# Patient Record
Sex: Male | Born: 1945 | Race: White | Hispanic: No | Marital: Married | Smoking: Former smoker
Health system: Southern US, Community
[De-identification: ages and names within clinical notes are randomized; demographics above are authoritative.]

## PROBLEM LIST (undated history)

## (undated) ENCOUNTER — Emergency Department (HOSPITAL_COMMUNITY): Payer: Self-pay | Source: Home / Self Care

## (undated) DIAGNOSIS — I5032 Chronic diastolic (congestive) heart failure: Secondary | ICD-10-CM

## (undated) DIAGNOSIS — I2510 Atherosclerotic heart disease of native coronary artery without angina pectoris: Secondary | ICD-10-CM

## (undated) DIAGNOSIS — E785 Hyperlipidemia, unspecified: Secondary | ICD-10-CM

## (undated) DIAGNOSIS — J309 Allergic rhinitis, unspecified: Secondary | ICD-10-CM

## (undated) DIAGNOSIS — Z9861 Coronary angioplasty status: Secondary | ICD-10-CM

## (undated) DIAGNOSIS — I350 Nonrheumatic aortic (valve) stenosis: Principal | ICD-10-CM

## (undated) DIAGNOSIS — I2119 ST elevation (STEMI) myocardial infarction involving other coronary artery of inferior wall: Secondary | ICD-10-CM

## (undated) HISTORY — DX: Allergic rhinitis, unspecified: J30.9

## (undated) HISTORY — DX: Hyperlipidemia, unspecified: E78.5

## (undated) HISTORY — DX: Nonrheumatic aortic (valve) stenosis: I35.0

## (undated) HISTORY — PX: TONSILLECTOMY: SUR1361

## (undated) HISTORY — DX: Chronic diastolic (congestive) heart failure: I50.32

## (undated) HISTORY — DX: ST elevation (STEMI) myocardial infarction involving other coronary artery of inferior wall: I21.19

---

## 2004-02-23 ENCOUNTER — Ambulatory Visit: Payer: Self-pay | Admitting: Internal Medicine

## 2004-03-01 ENCOUNTER — Ambulatory Visit: Payer: Self-pay | Admitting: Internal Medicine

## 2004-03-07 ENCOUNTER — Ambulatory Visit: Payer: Self-pay | Admitting: Internal Medicine

## 2004-03-13 ENCOUNTER — Ambulatory Visit: Payer: Self-pay | Admitting: Family Medicine

## 2004-07-08 ENCOUNTER — Ambulatory Visit: Payer: Self-pay | Admitting: Internal Medicine

## 2004-09-30 ENCOUNTER — Ambulatory Visit: Payer: Self-pay | Admitting: Internal Medicine

## 2005-02-03 ENCOUNTER — Ambulatory Visit: Payer: Self-pay | Admitting: Internal Medicine

## 2005-04-04 ENCOUNTER — Ambulatory Visit: Payer: Self-pay | Admitting: Internal Medicine

## 2006-01-09 ENCOUNTER — Ambulatory Visit: Payer: Self-pay | Admitting: Internal Medicine

## 2006-11-04 DIAGNOSIS — J309 Allergic rhinitis, unspecified: Secondary | ICD-10-CM | POA: Insufficient documentation

## 2006-11-04 DIAGNOSIS — E785 Hyperlipidemia, unspecified: Secondary | ICD-10-CM

## 2006-11-04 HISTORY — DX: Hyperlipidemia, unspecified: E78.5

## 2006-11-04 HISTORY — DX: Allergic rhinitis, unspecified: J30.9

## 2010-04-22 ENCOUNTER — Telehealth: Payer: Self-pay | Admitting: Internal Medicine

## 2010-04-22 DIAGNOSIS — Z0000 Encounter for general adult medical examination without abnormal findings: Secondary | ICD-10-CM

## 2010-04-22 NOTE — Telephone Encounter (Signed)
Appt scheduled 04/25/10 @1130  advised pt to come in at 11 for labwork per phone note//slm

## 2010-04-22 NOTE — Telephone Encounter (Signed)
Sunday please schedule him for 11:30 tomorrow or Thursday  time to come fasting at 11:00  for his blood work I put in future orders for his blood work.

## 2010-04-22 NOTE — Telephone Encounter (Signed)
Pt is req to come in for a complete cpx before end of month, for his insurance. Pls advise.

## 2010-04-25 ENCOUNTER — Encounter: Payer: Self-pay | Admitting: Internal Medicine

## 2010-04-25 ENCOUNTER — Ambulatory Visit (INDEPENDENT_AMBULATORY_CARE_PROVIDER_SITE_OTHER): Payer: BC Managed Care – PPO | Admitting: Internal Medicine

## 2010-04-25 VITALS — BP 140/90 | HR 68 | Temp 98.2°F | Resp 14 | Ht 72.0 in | Wt 188.0 lb

## 2010-04-25 DIAGNOSIS — Z0000 Encounter for general adult medical examination without abnormal findings: Secondary | ICD-10-CM

## 2010-04-25 DIAGNOSIS — M722 Plantar fascial fibromatosis: Secondary | ICD-10-CM | POA: Insufficient documentation

## 2010-04-25 DIAGNOSIS — J309 Allergic rhinitis, unspecified: Secondary | ICD-10-CM

## 2010-04-25 DIAGNOSIS — E785 Hyperlipidemia, unspecified: Secondary | ICD-10-CM

## 2010-04-25 LAB — POCT URINALYSIS DIPSTICK
Ketones, UA: NEGATIVE
Protein, UA: NEGATIVE
Spec Grav, UA: 1.02
Urobilinogen, UA: 0.2
pH, UA: 6

## 2010-04-25 LAB — CBC WITH DIFFERENTIAL/PLATELET
Basophils Absolute: 0 10*3/uL (ref 0.0–0.1)
Eosinophils Absolute: 0.1 10*3/uL (ref 0.0–0.7)
HCT: 44.2 % (ref 39.0–52.0)
Hemoglobin: 15.6 g/dL (ref 13.0–17.0)
Lymphs Abs: 1.9 10*3/uL (ref 0.7–4.0)
MCHC: 35.2 g/dL (ref 30.0–36.0)
MCV: 90.5 fl (ref 78.0–100.0)
Monocytes Absolute: 0.5 10*3/uL (ref 0.1–1.0)
Monocytes Relative: 7.3 % (ref 3.0–12.0)
Neutro Abs: 4.1 10*3/uL (ref 1.4–7.7)
Platelets: 164 10*3/uL (ref 150.0–400.0)
RDW: 13.7 % (ref 11.5–14.6)

## 2010-04-25 LAB — BASIC METABOLIC PANEL
CO2: 28 mEq/L (ref 19–32)
Calcium: 9.1 mg/dL (ref 8.4–10.5)
Creatinine, Ser: 0.8 mg/dL (ref 0.4–1.5)
GFR: 102.32 mL/min (ref >60.00)
Sodium: 137 mEq/L (ref 135–145)

## 2010-04-25 LAB — HEPATIC FUNCTION PANEL
Albumin: 4.2 g/dL (ref 3.5–5.2)
Alkaline Phosphatase: 64 U/L (ref 39–117)
Bilirubin, Direct: 0.1 mg/dL (ref 0.0–0.3)
Total Protein: 6.8 g/dL (ref 6.0–8.3)

## 2010-04-25 LAB — LIPID PANEL
HDL: 57.8 mg/dL (ref >39.00)
Triglycerides: 127 mg/dL (ref 0.0–149.0)
VLDL: 25.4 mg/dL (ref 0.0–40.0)

## 2010-04-25 NOTE — Progress Notes (Signed)
  Subjective:    Patient ID: Marcus Mills, male    DOB: 05-11-1945, 65 y.o.   MRN: [id number]  HPI  patient presents for complete physical examination. The patient has a history of hyperlipidemia and allergic rhinitis and presents for his yearly examination he has not had his laboratory values drawn yet he has a chief complaint today of bilateral foot pain worse on the right than the left he states that this has been diagnosed as plantar fasciitis in the past his symptoms have been present for approximately 2 years he states that he has pain in the morning when he first gets out of bed and it improves as the day goes on he has attempted stretching and arch supports as they intervention which has been partially successful.  He has exercised regularly his weight is stable and he has been watching his diet for a modified low cholesterol. His current medications were reviewed with the patient, as well as his past medical history social history surgical history  And problem list.   Review of Systems  Constitutional: Negative for fever and fatigue.        Male pattern balding  HENT: Negative for hearing loss, congestion, neck pain and postnasal drip.   Eyes: Negative for discharge, redness and visual disturbance.  Respiratory: Negative for cough, shortness of breath and wheezing.   Cardiovascular: Negative for leg swelling.  Gastrointestinal: Negative for abdominal pain, constipation and abdominal distention.  Genitourinary: Negative for urgency and frequency.        Prostate is +2 enlarged no bogginess detected architecture and symmetry normal  Musculoskeletal: Negative.  Negative for joint swelling and arthralgias.  Skin: Negative.  Negative for color change and rash.        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  Hematological: Negative for adenopathy.  Psychiatric/Behavioral: Negative for behavioral problems.         Objective:   Physical Exam        Assessment & Plan:

## 2010-04-25 NOTE — Assessment & Plan Note (Signed)
He currently is on no medications for treatment of hyperlipidemia with a previous diagnosis he has lost weight and exercises regular he is following a low-cholesterol diet we will monitor his lipid panel at this time and see if intervention is wanted it is I would recommend  Fish oil thousand milligrams 2 by mouth twice a day

## 2010-04-25 NOTE — Assessment & Plan Note (Signed)
He has seasonal allergies and is currently not experiencing any symptoms of allergic rhinitis it was recommended to use Claritin or Zyrtec when necessary during his allergy season

## 2010-04-25 NOTE — Assessment & Plan Note (Signed)
He presents for his complete physical examination and appropriate screening laboratory values were obtained today and will be reviewed and the patient notified of the results we recommended a flu vaccination on a yearly basis the patient has not received a flu vaccination for over 2 years we discussed the benefit of immunization in prevention of disease specifically what immunization could do to enhance his overall health.   we discussed screening colonoscopy and other health maintenance issues he is up-to-date at this point we have agreed to begin a yearly program of health maintenance with physical examinations on a yearly basis.

## 2010-04-25 NOTE — Assessment & Plan Note (Signed)
The patient has pain on the plantar fascia after resting or sleeping stretching helps the pain he has not used ice or heat at this time.  He has used arch supports which seems to help but the pain has been recurrent over 2-3 years.   we have recommended that he obtain new arch supports not go barefoot at any time stretch 3-4 times daily using a heel cup stretch and use a frozen coke bottle as the massaging and stretching device nightly if these interventions fail either a referral to podiatry or a fascial injection is warranted

## 2010-04-25 NOTE — Patient Instructions (Signed)
Consider using salt palmetto herbal supplement for your benign prostatic hypertrophy. Sure that whatever combination Includes zinc. For the plantar fasciitis I recommend active stretching using the he'll drop stretch on the stairs the use of a frozen Coca-Cola bottle for rolling massage wearing arch supports and if these interventions are ineffective he may schedule a plantar fascial injection. We will review your laboratory work and notify you of any further interventions based upon these we will monitor your cholesterol and see if you are at your current goals if not we would recommend beginning fish oil

## 2010-04-26 ENCOUNTER — Encounter: Payer: Self-pay | Admitting: Internal Medicine

## 2010-05-01 NOTE — Progress Notes (Signed)
  Subjective:    Patient ID: Marcus Mills, male    DOB: [birth year], 65 y.o.   MRN: [id number]  HPI    Review of Systems     Objective:   Physical Exam  Constitutional: He is oriented to person, place, and time. He appears well-developed and well-nourished.        Well-developed well-nourished white male in no apparent distress with male pattern balding  HENT:  Head: Normocephalic and atraumatic.  Eyes: Conjunctivae are normal. Pupils are equal, round, and reactive to light.  Neck: Normal range of motion. Neck supple.  Cardiovascular: Normal rate and regular rhythm.   Pulmonary/Chest: Effort normal and breath sounds normal.  Abdominal: Soft. Bowel sounds are normal.  Genitourinary: Rectum normal, prostate normal and penis normal.  Neurological: He is alert and oriented to person, place, and time. He has normal reflexes.  Skin: Skin is warm and dry.  Psychiatric: He has a normal mood and affect. His behavior is normal. Judgment and thought content normal.          Assessment & Plan:

## 2010-05-09 ENCOUNTER — Telehealth: Payer: Self-pay | Admitting: Internal Medicine

## 2010-05-09 NOTE — Telephone Encounter (Signed)
Triage vm----please return call about where to get the corn removed and about his statin rx.

## 2010-05-09 NOTE — Telephone Encounter (Signed)
Talked with pt and you and he discussed him going on a statin and he would like to go on a statin like his wife's that she on ly takes once a week.  ( I cant find anything in  Chart about a statin she is taking-)  I gave him Dr Legrand Pitts name for removal of corn

## 2010-05-10 NOTE — Telephone Encounter (Signed)
We'll begin this patient on Crestor 20 mg one by mouth weekly I would recommend that he come by the office and pick up some samples next week to see if we can demonstrate the effectiveness of pulse therapy for him. I will have the samples in my office

## 2010-05-15 ENCOUNTER — Other Ambulatory Visit: Payer: Self-pay | Admitting: *Deleted

## 2010-05-15 MED ORDER — PITAVASTATIN CALCIUM 4 MG PO TABS: 4.0000 mg | ORAL_TABLET | ORAL | Status: DC

## 2010-05-15 NOTE — Telephone Encounter (Signed)
DR Lovell Sheehan CHANGED TO LIVALO LIKE WIFE IS TAKING AND THAT WAS SENT IN TO PHARMACY OF CHOICE

## 2010-07-10 ENCOUNTER — Telehealth: Payer: Self-pay | Admitting: Internal Medicine

## 2010-07-10 MED ORDER — FEXOFENADINE-PSEUDOEPHED ER 180-240 MG PO TB24: 1.0000 | ORAL_TABLET | Freq: Every day | ORAL | Status: DC

## 2010-07-10 NOTE — Telephone Encounter (Signed)
Pt called and is req to get Allegra D calld in to [facility] in [place] for allergies.

## 2011-02-10 ENCOUNTER — Telehealth: Payer: Self-pay | Admitting: *Deleted

## 2011-02-10 MED ORDER — AZITHROMYCIN 250 MG PO TABS: ORAL_TABLET | ORAL | Status: AC

## 2011-02-10 NOTE — Telephone Encounter (Signed)
Per dr jenkins-may have z pack 

## 2011-02-10 NOTE — Telephone Encounter (Signed)
Patient's wife is calling because husband has the same symptoms with cough, drainage for 1 week.  Can a rx be called in?

## 2011-04-24 ENCOUNTER — Telehealth: Payer: Self-pay | Admitting: Family Medicine

## 2011-04-24 NOTE — Telephone Encounter (Signed)
Marcus Mills, this patient is really needing a physical. Due to his insurance, he needs it before February is over. They will be back in [place] the wk of 2/17. Is there any way this is possible? I told the wife I would ask, and we'd call them back. Thanks.

## 2011-04-25 NOTE — Telephone Encounter (Signed)
Monday at 8:30

## 2011-04-25 NOTE — Telephone Encounter (Signed)
We have nothing by the end of the mo nth if he can not come [place]

## 2011-04-25 NOTE — Telephone Encounter (Signed)
Called pt and LMOM and let him know.

## 2011-05-13 ENCOUNTER — Telehealth: Payer: Self-pay | Admitting: Internal Medicine

## 2011-05-13 NOTE — Telephone Encounter (Signed)
Sent and wife informed he must bring lab with him

## 2011-05-13 NOTE — Telephone Encounter (Signed)
Pt is req to get a lab order for cpx labs, mailed to pts [address] [facility] [address] , so he can get labs drawn at [facility], which is closer to their home.  Pts cpx is sch for 06/06/11.

## 2011-05-26 ENCOUNTER — Other Ambulatory Visit: Payer: BC Managed Care – PPO

## 2011-06-06 ENCOUNTER — Ambulatory Visit (INDEPENDENT_AMBULATORY_CARE_PROVIDER_SITE_OTHER): Payer: BC Managed Care – PPO | Admitting: Internal Medicine

## 2011-06-06 VITALS — BP 130/80 | HR 72 | Temp 98.3°F | Resp 16 | Ht 70.0 in | Wt 198.0 lb

## 2011-06-06 DIAGNOSIS — Z23 Encounter for immunization: Secondary | ICD-10-CM

## 2011-06-06 DIAGNOSIS — Z2911 Encounter for prophylactic immunotherapy for respiratory syncytial virus (RSV): Secondary | ICD-10-CM

## 2011-06-06 DIAGNOSIS — E785 Hyperlipidemia, unspecified: Secondary | ICD-10-CM

## 2011-06-06 DIAGNOSIS — Z0000 Encounter for general adult medical examination without abnormal findings: Secondary | ICD-10-CM

## 2011-06-06 MED ORDER — ROSUVASTATIN CALCIUM 20 MG PO TABS: 20.0000 mg | ORAL_TABLET | Freq: Two times a day (BID) | ORAL | Status: DC

## 2011-06-06 MED ORDER — ROSUVASTATIN CALCIUM 20 MG PO TABS: 20.0000 mg | ORAL_TABLET | ORAL | Status: DC

## 2011-06-10 ENCOUNTER — Encounter: Payer: Self-pay | Admitting: Internal Medicine

## 2011-06-10 NOTE — Patient Instructions (Signed)
The patient is instructed to continue all medications as prescribed. Schedule followup with check out clerk upon leaving the clinic  

## 2011-06-10 NOTE — Progress Notes (Signed)
Subjective:    Patient ID: Marcus Mills, male    DOB: [birth year], 66 y.o.   MRN: [id number]  HPI The patient presents for routine complete physical examination his active medical problems include hyperlipidemia allergic rhinitis a history of plantar fasciitis.  His current medications were reviewed with him including issues of omega-3 fatty acids he has not been on a statin which he was originally placed on a pulse therapy statin. He discontinued it several weeks prior to his blood work    Review of Systems  Constitutional: Negative for fever and fatigue.  HENT: Negative for hearing loss, congestion, neck pain and postnasal drip.   Eyes: Negative for discharge, redness and visual disturbance.  Respiratory: Negative for cough, shortness of breath and wheezing.   Cardiovascular: Negative for leg swelling.  Gastrointestinal: Negative for abdominal pain, constipation and abdominal distention.  Genitourinary: Negative for urgency and frequency.  Musculoskeletal: Negative for joint swelling and arthralgias.  Skin: Negative for color change and rash.  Neurological: Negative for weakness and light-headedness.  Hematological: Negative for adenopathy.  Psychiatric/Behavioral: Negative for behavioral problems.   Past Medical History  Diagnosis Date  . ALLERGIC RHINITIS 11/04/2006  . HYPERLIPIDEMIA 11/04/2006    History   Social History  . Marital Status: Married    Spouse Name: N/A    Number of Children: N/A  . Years of Education: N/A   Occupational History  . salesman    Social History Main Topics  . Smoking status: Former Smoker    Quit date: 04/26/1987  . Smokeless tobacco: Not on file  . Alcohol Use: 0.5 oz/week    1 drink(s) per week  . Drug Use: No  . Sexually Active: Yes   Other Topics Concern  . Not on file   Social History Narrative  . No narrative on file    Past Surgical History  Procedure Date  . Tonsillectomy     Family History  Problem Relation Age  of Onset  . Hypertension Mother   . Cancer Mother   . Cancer Father   . Breast cancer Sister     No Known Allergies  Current Outpatient Prescriptions on File Prior to Visit  Medication Sig Dispense Refill  . Cholecalciferol (RA VITAMIN D-3) 2000 UNITS CAPS Take by mouth.        . fexofenadine-pseudoephedrine (ALLEGRA-D 24) 180-240 MG per 24 hr tablet Take 1 tablet by mouth daily.  30 tablet  3  . fish oil-omega-3 fatty acids 1000 MG capsule Take 2 g by mouth daily.        . folic acid (FOLVITE) 1 MG tablet Take 1 mg by mouth daily.        . Multiple Vitamin (MULTIVITAMIN) tablet Take 1 tablet by mouth daily.          BP 130/80  Pulse 72  Temp 98.3 F (36.8 C)  Resp 16  Ht 5\' 10"  (1.778 m)  Wt 198 lb (89.812 kg)  BMI 28.41 kg/m2       Objective:   Physical Exam  Vitals reviewed. Constitutional: He is oriented to person, place, and time. He appears well-developed and well-nourished.  HENT:  Head: Normocephalic and atraumatic.  Eyes: Conjunctivae are normal. Pupils are equal, round, and reactive to light.  Neck: Normal range of motion. Neck supple.  Cardiovascular: Normal rate and regular rhythm.   Pulmonary/Chest: Effort normal and breath sounds normal.  Abdominal: Soft. Bowel sounds are normal.  Genitourinary: Rectum normal and prostate normal.  Musculoskeletal: Normal range of motion.  Neurological: He is alert and oriented to person, place, and time.  Skin: Skin is warm and dry.  Psychiatric: He has a normal mood and affect. His behavior is normal.          Assessment & Plan:    Patient presents for yearly preventative medicine examination.   all immunizations and health maintenance protocols were reviewed with the patient and they are up to date with these protocols.   screening laboratory values were reviewed with the patient including screening of hyperlipidemia PSA renal function and hepatic function.   There medications past medical history social  history problem list and allergies were reviewed in detail.   Goals were established with regard to weight loss exercise diet in compliance with medications  Reviewed the patient's lipid results and do to his age, family history and elevated lipids he has risk factors enough to warrant resuming a statin to control his cholesterol.  We will place him on Crestor 20 mg on Monday and Friday and plan a return office visit within 3 months to recheck a lipid and liver at that time.

## 2011-06-11 ENCOUNTER — Encounter: Payer: Self-pay | Admitting: Internal Medicine

## 2011-06-23 ENCOUNTER — Other Ambulatory Visit: Payer: Self-pay | Admitting: *Deleted

## 2011-07-26 ENCOUNTER — Other Ambulatory Visit: Payer: Self-pay | Admitting: Internal Medicine

## 2011-11-12 ENCOUNTER — Ambulatory Visit (INDEPENDENT_AMBULATORY_CARE_PROVIDER_SITE_OTHER)
Admission: RE | Admit: 2011-11-12 | Discharge: 2011-11-12 | Disposition: A | Discharge status: Home / Self Care | Payer: BC Managed Care – PPO | Source: Ambulatory Visit | Attending: Family Medicine | Admitting: Family Medicine

## 2011-11-12 ENCOUNTER — Ambulatory Visit (INDEPENDENT_AMBULATORY_CARE_PROVIDER_SITE_OTHER): Payer: BC Managed Care – PPO | Admitting: Family Medicine

## 2011-11-12 ENCOUNTER — Encounter: Payer: Self-pay | Admitting: Family Medicine

## 2011-11-12 VITALS — BP 122/90 | Temp 97.9°F | Wt 192.0 lb

## 2011-11-12 DIAGNOSIS — M545 Low back pain, unspecified: Secondary | ICD-10-CM

## 2011-11-12 MED ORDER — DICLOFENAC EPOLAMINE 1.3 % TD PTCH: 1.0000 | MEDICATED_PATCH | Freq: Two times a day (BID) | TRANSDERMAL | Status: DC

## 2011-11-12 NOTE — Patient Instructions (Addendum)
Low Back Sprain with Rehab  A sprain is an injury in which a ligament is torn. The ligaments of the lower back are vulnerable to sprains. However, they are strong and require great force to be injured. These ligaments are important for stabilizing the spinal column. Sprains are classified into three categories. Grade 1 sprains cause pain, but the tendon is not lengthened. Grade 2 sprains include a lengthened ligament, due to the ligament being stretched or partially ruptured. With grade 2 sprains there is still function, although the function may be decreased. Grade 3 sprains involve a complete tear of the tendon or muscle, and function is usually impaired. SYMPTOMS   Severe pain in the lower back.   Sometimes, a feeling of a "pop," "snap," or tear, at the time of injury.   Tenderness and sometimes swelling at the injury site.   Uncommonly, bruising (contusion) within 48 hours of injury.   Muscle spasms in the back.  CAUSES  Low back sprains occur when a force is placed on the ligaments that is greater than they can handle. Common causes of injury include:  Performing a stressful act while off-balance.   Repetitive stressful activities that involve movement of the lower back.   Direct hit (trauma) to the lower back.  RISK INCREASES WITH:  Contact sports (football, wrestling).   Collisions (major skiing accidents).   Sports that require throwing or lifting (baseball, weightlifting).   Sports involving twisting of the spine (gymnastics, diving, tennis, golf).   Poor strength and flexibility.   Inadequate protection.   Previous back injury or surgery (especially fusion).  PREVENTION  Wear properly fitted and padded protective equipment.   Warm up and stretch properly before activity.   Allow for adequate recovery between workouts.   Maintain physical fitness:   Strength, flexibility, and endurance.   Cardiovascular fitness.   Maintain a healthy body weight.  PROGNOSIS   If treated properly, low back sprains usually heal with non-surgical treatment. The length of time for healing depends on the severity of the injury.  RELATED COMPLICATIONS   Recurring symptoms, resulting in a chronic problem.   Chronic inflammation and pain in the low back.   Delayed healing or resolution of symptoms, especially if activity is resumed too soon.   Prolonged impairment.   Unstable or arthritic joints of the low back.  TREATMENT  Treatment first involves the use of ice and medicine, to reduce pain and inflammation. The use of strengthening and stretching exercises may help reduce pain with activity. These exercises may be performed at home or with a therapist. Severe injuries may require referral to a therapist for further evaluation and treatment, such as ultrasound. Your caregiver may advise that you wear a back brace or corset, to help reduce pain and discomfort. Often, prolonged bed rest results in greater harm then benefit. Corticosteroid injections may be recommended. However, these should be reserved for the most serious cases. It is important to avoid using your back when lifting objects. At night, sleep on your back on a firm mattress, with a pillow placed under your knees. If non-surgical treatment is unsuccessful, surgery may be needed.  MEDICATION   If pain medicine is needed, nonsteroidal anti-inflammatory medicines (aspirin and ibuprofen), or other minor pain relievers (acetaminophen), are often advised.   Do not take pain medicine for 7 days before surgery.   Prescription pain relievers may be given, if your caregiver thinks they are needed. Use only as directed and only as much as you   need.   Ointments applied to the skin may be helpful.   Corticosteroid injections may be given by your caregiver. These injections should be reserved for the most serious cases, because they may only be given a certain number of times.  HEAT AND COLD  Cold treatment (icing)  should be applied for 10 to 15 minutes every 2 to 3 hours for inflammation and pain, and immediately after activity that aggravates your symptoms. Use ice packs or an ice massage.   Heat treatment may be used before performing stretching and strengthening activities prescribed by your caregiver, physical therapist, or athletic trainer. Use a heat pack or a warm water soak.  SEEK MEDICAL CARE IF:   Symptoms get worse or do not improve in 2 to 4 weeks, despite treatment.   You develop numbness or weakness in either leg.   You lose bowel or bladder function.   Any of the following occur after surgery: fever, increased pain, swelling, redness, drainage of fluids, or bleeding in the affected area.   New, unexplained symptoms develop. (Drugs used in treatment may produce side effects.)  EXERCISES  RANGE OF MOTION (ROM) AND STRETCHING EXERCISES - Low Back Sprain Most people with lower back pain will find that their symptoms get worse with excessive bending forward (flexion) or arching at the lower back (extension). The exercises that will help resolve your symptoms will focus on the opposite motion.  Your physician, physical therapist or athletic trainer will help you determine which exercises will be most helpful to resolve your lower back pain. Do not complete any exercises without first consulting with your caregiver. Discontinue any exercises which make your symptoms worse, until you speak to your caregiver. If you have pain, numbness or tingling which travels down into your buttocks, leg or foot, the goal of the therapy is for these symptoms to move closer to your back and eventually resolve. Sometimes, these leg symptoms will get better, but your lower back pain may worsen. This is often an indication of progress in your rehabilitation. Be very alert to any changes in your symptoms and the activities in which you participated in the 24 hours prior to the change. Sharing this information with your  caregiver will allow him or her to most efficiently treat your condition. These exercises may help you when beginning to rehabilitate your injury. Your symptoms may resolve with or without further involvement from your physician, physical therapist or athletic trainer. While completing these exercises, remember:   Restoring tissue flexibility helps normal motion to return to the joints. This allows healthier, less painful movement and activity.   An effective stretch should be held for at least 30 seconds.   A stretch should never be painful. You should only feel a gentle lengthening or release in the stretched tissue.  FLEXION RANGE OF MOTION AND STRETCHING EXERCISES: STRETCH - Flexion, Single Knee to Chest   Lie on a firm bed or floor with both legs extended in front of you.   Keeping one leg in contact with the floor, bring your opposite knee to your chest. Hold your leg in place by either grabbing behind your thigh or at your knee.   Pull until you feel a gentle stretch in your low back. Hold __________ seconds.   Slowly release your grasp and repeat the exercise with the opposite side.  Repeat __________ times. Complete this exercise __________ times per day.  STRETCH - Flexion, Double Knee to Chest  Lie on a firm bed or   floor with both legs extended in front of you.   Keeping one leg in contact with the floor, bring your opposite knee to your chest.   Tense your stomach muscles to support your back and then lift your other knee to your chest. Hold your legs in place by either grabbing behind your thighs or at your knees.   Pull both knees toward your chest until you feel a gentle stretch in your low back. Hold __________ seconds.   Tense your stomach muscles and slowly return one leg at a time to the floor.  Repeat __________ times. Complete this exercise __________ times per day.  STRETCH - Low Trunk Rotation  Lie on a firm bed or floor. Keeping your legs in front of you, bend  your knees so they are both pointed toward the ceiling and your feet are flat on the floor.   Extend your arms out to the side. This will stabilize your upper body by keeping your shoulders in contact with the floor.   Gently and slowly drop both knees together to one side until you feel a gentle stretch in your low back. Hold for __________ seconds.   Tense your stomach muscles to support your lower back as you bring your knees back to the starting position. Repeat the exercise to the other side.  Repeat __________ times. Complete this exercise __________ times per day  EXTENSION RANGE OF MOTION AND FLEXIBILITY EXERCISES: STRETCH - Extension, Prone on Elbows   Lie on your stomach on the floor, a bed will be too soft. Place your palms about shoulder width apart and at the height of your head.   Place your elbows under your shoulders. If this is too painful, stack pillows under your chest.   Allow your body to relax so that your hips drop lower and make contact more completely with the floor.   Hold this position for __________ seconds.   Slowly return to lying flat on the floor.  Repeat __________ times. Complete this exercise __________ times per day.  RANGE OF MOTION - Extension, Prone Press Ups  Lie on your stomach on the floor, a bed will be too soft. Place your palms about shoulder width apart and at the height of your head.   Keeping your back as relaxed as possible, slowly straighten your elbows while keeping your hips on the floor. You may adjust the placement of your hands to maximize your comfort. As you gain motion, your hands will come more underneath your shoulders.   Hold this position __________ seconds.   Slowly return to lying flat on the floor.  Repeat __________ times. Complete this exercise __________ times per day.  RANGE OF MOTION- Quadruped, Neutral Spine   Assume a hands and knees position on a firm surface. Keep your hands under your shoulders and your knees  under your hips. You may place padding under your knees for comfort.   Drop your head and point your tailbone toward the ground below you. This will round out your lower back like an angry cat. Hold this position for __________ seconds.   Slowly lift your head and release your tail bone so that your back sags into a large arch, like an old horse.   Hold this position for __________ seconds.   Repeat this until you feel limber in your low back.   Now, find your "sweet spot." This will be the most comfortable position somewhere between the two previous positions. This is your neutral spine.   Once you have found this position, tense your stomach muscles to support your low back.   Hold this position for __________ seconds.  Repeat __________ times. Complete this exercise __________ times per day.  STRENGTHENING EXERCISES - Low Back Sprain These exercises may help you when beginning to rehabilitate your injury. These exercises should be done near your "sweet spot." This is the neutral, low-back arch, somewhere between fully rounded and fully arched, that is your least painful position. When performed in this safe range of motion, these exercises can be used for people who have either a flexion or extension based injury. These exercises may resolve your symptoms with or without further involvement from your physician, physical therapist or athletic trainer. While completing these exercises, remember:   Muscles can gain both the endurance and the strength needed for everyday activities through controlled exercises.   Complete these exercises as instructed by your physician, physical therapist or athletic trainer. Increase the resistance and repetitions only as guided.   You may experience muscle soreness or fatigue, but the pain or discomfort you are trying to eliminate should never worsen during these exercises. If this pain does worsen, stop and make certain you are following the directions exactly.  If the pain is still present after adjustments, discontinue the exercise until you can discuss the trouble with your caregiver.  STRENGTHENING - Deep Abdominals, Pelvic Tilt   Lie on a firm bed or floor. Keeping your legs in front of you, bend your knees so they are both pointed toward the ceiling and your feet are flat on the floor.   Tense your lower abdominal muscles to press your low back into the floor. This motion will rotate your pelvis so that your tail bone is scooping upwards rather than pointing at your feet or into the floor.  With a gentle tension and even breathing, hold this position for __________ seconds. Repeat __________ times. Complete this exercise __________ times per day.  STRENGTHENING - Abdominals, Crunches   Lie on a firm bed or floor. Keeping your legs in front of you, bend your knees so they are both pointed toward the ceiling and your feet are flat on the floor. Cross your arms over your chest.   Slightly tip your chin down without bending your neck.   Tense your abdominals and slowly lift your trunk high enough to just clear your shoulder blades. Lifting higher can put excessive stress on the lower back and does not further strengthen your abdominal muscles.   Control your return to the starting position.  Repeat __________ times. Complete this exercise __________ times per day.  STRENGTHENING - Quadruped, Opposite UE/LE Lift   Assume a hands and knees position on a firm surface. Keep your hands under your shoulders and your knees under your hips. You may place padding under your knees for comfort.   Find your neutral spine and gently tense your abdominal muscles so that you can maintain this position. Your shoulders and hips should form a rectangle that is parallel with the floor and is not twisted.   Keeping your trunk steady, lift your right hand no higher than your shoulder and then your left leg no higher than your hip. Make sure you are not holding your  breath. Hold this position for __________ seconds.   Continuing to keep your abdominal muscles tense and your back steady, slowly return to your starting position. Repeat with the opposite arm and leg.  Repeat __________ times. Complete this exercise __________ times   per day.  STRENGTHENING - Abdominals and Quadriceps, Straight Leg Raise   Lie on a firm bed or floor with both legs extended in front of you.   Keeping one leg in contact with the floor, bend the other knee so that your foot can rest flat on the floor.   Find your neutral spine, and tense your abdominal muscles to maintain your spinal position throughout the exercise.   Slowly lift your straight leg off the floor about 6 inches for a count of 15, making sure to not hold your breath.   Still keeping your neutral spine, slowly lower your leg all the way to the floor.  Repeat this exercise with each leg __________ times. Complete this exercise __________ times per day. POSTURE AND BODY MECHANICS CONSIDERATIONS - Low Back Sprain Keeping correct posture when sitting, standing or completing your activities will reduce the stress put on different body tissues, allowing injured tissues a chance to heal and limiting painful experiences. The following are general guidelines for improved posture. Your physician or physical therapist will provide you with any instructions specific to your needs. While reading these guidelines, remember:  The exercises prescribed by your provider will help you have the flexibility and strength to maintain correct postures.   The correct posture provides the best environment for your joints to work. All of your joints have less wear and tear when properly supported by a spine with good posture. This means you will experience a healthier, less painful body.   Correct posture must be practiced with all of your activities, especially prolonged sitting and standing. Correct posture is as important when doing  repetitive low-stress activities (typing) as it is when doing a single heavy-load activity (lifting).  RESTING POSITIONS Consider which positions are most painful for you when choosing a resting position. If you have pain with flexion-based activities (sitting, bending, stooping, squatting), choose a position that allows you to rest in a less flexed posture. You would want to avoid curling into a fetal position on your side. If your pain worsens with extension-based activities (prolonged standing, working overhead), avoid resting in an extended position such as sleeping on your stomach. Most people will find more comfort when they rest with their spine in a more neutral position, neither too rounded nor too arched. Lying on a non-sagging bed on your side with a pillow between your knees, or on your back with a pillow under your knees will often provide some relief. Keep in mind, being in any one position for a prolonged period of time, no matter how correct your posture, can still lead to stiffness. PROPER SITTING POSTURE In order to minimize stress and discomfort on your spine, you must sit with correct posture. Sitting with good posture should be effortless for a healthy body. Returning to good posture is a gradual process. Many people can work toward this most comfortably by using various supports until they have the flexibility and strength to maintain this posture on their own. When sitting with proper posture, your ears will fall over your shoulders and your shoulders will fall over your hips. You should use the back of the chair to support your upper back. Your lower back will be in a neutral position, just slightly arched. You may place a small pillow or folded towel at the base of your lower back for  support.  When working at a desk, create an environment that supports good, upright posture. Without extra support, muscles tire, which leads to excessive   strain on joints and other tissues. Keep these  recommendations in mind: CHAIR:  A chair should be able to slide under your desk when your back makes contact with the back of the chair. This allows you to work closely.   The chair's height should allow your eyes to be level with the upper part of your monitor and your hands to be slightly lower than your elbows.  BODY POSITION  Your feet should make contact with the floor. If this is not possible, use a foot rest.   Keep your ears over your shoulders. This will reduce stress on your neck and low back.  INCORRECT SITTING POSTURES  If you are feeling tired and unable to assume a healthy sitting posture, do not slouch or slump. This puts excessive strain on your back tissues, causing more damage and pain. Healthier options include:  Using more support, like a lumbar pillow.   Switching tasks to something that requires you to be upright or walking.   Talking a brief walk.   Lying down to rest in a neutral-spine position.  PROLONGED STANDING WHILE SLIGHTLY LEANING FORWARD  When completing a task that requires you to lean forward while standing in one place for a long time, place either foot up on a stationary 2-4 inch high object to help maintain the best posture. When both feet are on the ground, the lower back tends to lose its slight inward curve. If this curve flattens (or becomes too large), then the back and your other joints will experience too much stress, tire more quickly, and can cause pain. CORRECT STANDING POSTURES Proper standing posture should be assumed with all daily activities, even if they only take a few moments, like when brushing your teeth. As in sitting, your ears should fall over your shoulders and your shoulders should fall over your hips. You should keep a slight tension in your abdominal muscles to brace your spine. Your tailbone should point down to the ground, not behind your body, resulting in an over-extended swayback posture.  INCORRECT STANDING POSTURES    Common incorrect standing postures include a forward head, locked knees and/or an excessive swayback. WALKING Walk with an upright posture. Your ears, shoulders and hips should all line-up. PROLONGED ACTIVITY IN A FLEXED POSITION When completing a task that requires you to bend forward at your waist or lean over a low surface, try to find a way to stabilize 3 out of 4 of your limbs. You can place a hand or elbow on your thigh or rest a knee on the surface you are reaching across. This will provide you more stability, so that your muscles do not tire as quickly. By keeping your knees relaxed, or slightly bent, you will also reduce stress across your lower back. CORRECT LIFTING TECHNIQUES DO :  Assume a wide stance. This will provide you more stability and the opportunity to get as close as possible to the object which you are lifting.   Tense your abdominals to brace your spine. Bend at the knees and hips. Keeping your back locked in a neutral-spine position, lift using your leg muscles. Lift with your legs, keeping your back straight.   Test the weight of unknown objects before attempting to lift them.   Try to keep your elbows locked down at your sides in order get the best strength from your shoulders when carrying an object.   Always ask for help when lifting heavy or awkward objects.  INCORRECT LIFTING TECHNIQUES DO   NOT:   Lock your knees when lifting, even if it is a small object.   Bend and twist. Pivot at your feet or move your feet when needing to change directions.   Assume that you can safely pick up even a paperclip without proper posture.  Document Released: 03/03/2005 Document Revised: 02/20/2011 Document Reviewed: 06/15/2008 [employer] Patient Information 2012 [employer]. 

## 2011-11-12 NOTE — Progress Notes (Signed)
  Subjective:    Patient ID: Marcus Mills, male    DOB: 06/23/1945, 66 y.o.   MRN: [id number]  HPI  Patient seen with low back pain. Larey Seat almost 2 weeks: 11/01/2011. He was trimming a limb out of a tree on 12 foot ladder. Branch came down and hit him in the head but there was no loss of consciousness. He was knocked off ladder and landed on his heels and back side. Noted some lumbar back pain and left ankle pain. Went to emergency room in [facility]. X-rays left foot and ankle unremarkable. Patient prescribed Vicodin, naproxen, and Flexeril without much relief. Had some continued low back pain mostly upper lumbar area. Pain is worse with standing and sitting and relieved with stretching out. No radiculopathy symptoms. Pain is located upper lumbar area. No numbness or weakness. No urine incontinence. Pain is moderate. Interfering with sleep. He denies without much relief. Pain radiates slightly left of upper lumbar area.  No hematuria   Review of Systems  HENT: Negative for neck pain.   Gastrointestinal: Negative for abdominal pain.  Genitourinary: Negative for hematuria.  Musculoskeletal: Positive for back pain.  Neurological: Negative for dizziness and headaches.  Psychiatric/Behavioral: Negative for confusion.       Objective:   Physical Exam  Constitutional: He appears well-developed and well-nourished. No distress.  Cardiovascular: Normal rate and regular rhythm.   Pulmonary/Chest: Effort normal and breath sounds normal. No respiratory distress. He has no wheezes. He has no rales.  Musculoskeletal: He exhibits no edema.       Straight leg raises are negative bilaterally Lumbar and thoracic back reveal no bruising. No swelling. Minimal tenderness around L1-L2 region. He has some paralumbar tenderness musculature or left side  Neurological:       Full-strength lower extremities. Symmetric lower extremity reflexes.          Assessment & Plan:  Upper lumbar back  pain following fall. Lumbar back films. Rule out fracture.  diclofenac skin patch twice daily. Stretches given. Consider physical therapy if not improving soon

## 2011-11-18 ENCOUNTER — Telehealth: Payer: Self-pay | Admitting: Family Medicine

## 2011-11-18 DIAGNOSIS — M549 Dorsalgia, unspecified: Secondary | ICD-10-CM | POA: Insufficient documentation

## 2011-11-18 NOTE — Telephone Encounter (Signed)
We need to explain to patient that insurance will not cover this medication for him  See how his pain is doing.  If still needing something for pain, let me know and we can consider oral pain med.

## 2011-11-18 NOTE — Telephone Encounter (Signed)
Per pt's insurance, the Prior Auth or [employer] 1.3% was denied for the following reason: this med is only approved with a "Dx of acute pain due to a minor sprain, strain, or contusion". I did send them all the records to read over, so they had all information regarding pt's injury. Please advise as to appeal or new med. Thank you.

## 2011-11-19 DIAGNOSIS — S32019A Unspecified fracture of first lumbar vertebra, initial encounter for closed fracture: Secondary | ICD-10-CM | POA: Insufficient documentation

## 2011-11-19 NOTE — Telephone Encounter (Signed)
Marcus Mills, can you call the pt and advise re Dr Lucie Leather instructions please?

## 2011-11-20 NOTE — Telephone Encounter (Signed)
Pt informed on personally identified VM re: initial med not covered by insurance, however if he is still experiencing pain, call back for oral pain med

## 2011-12-08 ENCOUNTER — Ambulatory Visit: Payer: BC Managed Care – PPO | Admitting: Internal Medicine

## 2012-01-03 ENCOUNTER — Encounter: Payer: Self-pay | Admitting: Family Medicine

## 2012-01-03 ENCOUNTER — Telehealth: Payer: Self-pay | Admitting: Internal Medicine

## 2012-01-03 ENCOUNTER — Ambulatory Visit (INDEPENDENT_AMBULATORY_CARE_PROVIDER_SITE_OTHER): Payer: BC Managed Care – PPO | Admitting: Family Medicine

## 2012-01-03 VITALS — BP 130/88 | HR 74 | Temp 98.7°F | Wt 177.0 lb

## 2012-01-03 DIAGNOSIS — B029 Zoster without complications: Secondary | ICD-10-CM | POA: Insufficient documentation

## 2012-01-03 NOTE — Progress Notes (Signed)
  Subjective:    Patient ID: Marcus Mills, male    DOB: 15-Aug-1945, 65 y.o.   MRN: [id number]  HPI  66 year old male with new onset rash  6 days ago. First appeared red bumps at left lip edge then under nose. Gradually over the week spread to left cheek. Preceeded by sensitivity in the area.  Painful (feels like needles), no clear blisters. Initial lesions are drying up.   Left upper teeth sensitive. No visual change. No fever. No flu like illness.  No new exposures.  Has had shingles vaccines.  Seen at urgent care yesterday by PA.. Given valcyclovir and bactrim  To cover shingels as well as bacterial infection.   Taking hydrocodone for back pain.  Has     Review of Systems  Constitutional: Negative for fever and fatigue.  HENT: Negative for ear pain.   Respiratory: Negative for cough.   Cardiovascular: Negative for chest pain.  Gastrointestinal: Negative for abdominal pain.       Objective:   Physical Exam  Constitutional: He appears well-developed and well-nourished.  HENT:  Right Ear: External ear normal.  Left Ear: External ear normal.  Nose: Nose normal.  Mouth/Throat: Oropharynx is clear and moist.  Eyes: Conjunctivae normal and EOM are normal. Pupils are equal, round, and reactive to light.  Neck: Normal range of motion. Neck supple.  Cardiovascular: Normal rate.   Pulmonary/Chest: Effort normal and breath sounds normal.  Skin:       Erythema and scabbing at left lip edge, under nose, erythematous papule, no vesicles under left eye and on cheek. No pusules. Tender to palpation          Assessment & Plan:

## 2012-01-03 NOTE — Telephone Encounter (Signed)
Caller: [place]/Patient; PCP: Darryll Capers (Adults only); CB#: [phone]; Call regarding Facial rash L cheek and under L eye; onset 12/29/11.  States he had a vaccine a year ago.  States seen in UC 01/02/12; started acyclovir, bactrim.    Face is mildly puffy/swollen.  States the rash is approaching the lower lid.  Per protocol, emergent symptoms denied; advised appointment within 24 hours.  Appt scheduled 01/03/12 1130 with Dr. Ermalene Searing in [place] office.

## 2012-01-03 NOTE — Assessment & Plan Note (Signed)
Given location.. See eye MD ASAP for eye exam. Valacyclovir likely not to be helpful since started late but since has from urgent acar go ahead and complete.  Doubt bacterial in fection but okay to complete bactrim 10 day course since you have it from Urgent care. Most important is pain control.. Use ibuprofen and vicodin. Call if not controlled.

## 2012-01-03 NOTE — Patient Instructions (Addendum)
Use vicodin every 6-8 hour for breakthrough pain.  Use ibuprofen 800 mg every 8 hours.  Complete bactrim and valacyclovir.  Call if pain not controlled.  Get eye MD  appt for left eye evaluation.

## 2012-01-06 ENCOUNTER — Telehealth: Payer: Self-pay | Admitting: Internal Medicine

## 2012-01-06 NOTE — Telephone Encounter (Signed)
[facility] Triage Call Report Triage Record Num: [id number] Operator: Chevis Pretty Patient Name: Marcus Mills Call Date & Time: 01/03/2012 8:42:35AM Patient Phone: [phone] PCP: Darryll Capers Patient Gender: Male PCP Fax : [phone] Patient DOB: 1945/10/04 [facility] Name: [facility]  Reason for Call: Caller: Marcus Mills/Patient; PCP: Darryll Capers (Adults only); CB#: [phone]; Call regarding Facial rash L cheek and under L eye; onset 12/29/11. States he had a vaccine a year ago. States seen in UC 01/02/12; started acyclovir, bactrim. Face is mildly puffy/swollen. States the rash is approaching the lower lid. Per protocol, emergent symptoms denied; advised appointment within 24 hours. Appt scheduled 01/03/12 1130 with Dr. Ermalene Searing in [place] office. Protocol(s) Used: Face Pain or Swelling Recommended Outcome per Protocol: See Provider within 24 hours Reason for Outcome: Painful OR itchy rash on face Care Advice: ~ Call provider immediately if rash near to or involves eyes or tip of nose. ~ Call provider if signs of infection occur such as tenderness, swelling, redness, or temperature elevation. During pregnancy, call provider if temperature is 100 F (37.7 C) or greater OR any temperature elevation for 3 days even while taking acetaminophen. ~ ~ SYMPTOM / CONDITION MANAGEMENT ~ CAUTIONS ~ List, or take, all current prescription(s), nonprescription or alternative medication(s) to provider for evaluation. Analgesic/Antipyretic Advice - Acetaminophen: Consider acetaminophen as directed on label or by pharmacist/provider for pain or fever PRECAUTIONS: - Use if there is no history of liver disease, alcoholism, or intake of three or more alcohol drinks per day - Only if approved by provider during pregnancy or when breastfeeding - During pregnancy, acetaminophen should not be taken more than 3 consecutive days without telling provider - Do not exceed recommended dose or  frequency ~ Analgesic/Antipyretic Advice - NSAIDs: Consider aspirin, ibuprofen, naproxen or ketoprofen for pain or fever as directed on label or by pharmacist/provider. PRECAUTIONS: - If over 21 years of age, should not take longer than 1 week without consulting provider. EXCEPTIONS: - Should not be used if taking blood thinners or have bleeding problems. - Do not use if have history of sensitivity/allergy to any of these medications; or history of cardiovascular, ulcer, kidney, liver disease or diabetes unless approved by provider. - Do not exceed recommended dose or frequenc

## 2015-07-06 DIAGNOSIS — E785 Hyperlipidemia, unspecified: Secondary | ICD-10-CM | POA: Diagnosis not present

## 2015-07-06 DIAGNOSIS — Z0000 Encounter for general adult medical examination without abnormal findings: Secondary | ICD-10-CM | POA: Diagnosis not present

## 2015-07-13 DIAGNOSIS — Z1389 Encounter for screening for other disorder: Secondary | ICD-10-CM | POA: Diagnosis not present

## 2015-07-13 DIAGNOSIS — Z6829 Body mass index (BMI) 29.0-29.9, adult: Secondary | ICD-10-CM | POA: Diagnosis not present

## 2015-07-13 DIAGNOSIS — Z23 Encounter for immunization: Secondary | ICD-10-CM | POA: Diagnosis not present

## 2015-07-13 DIAGNOSIS — R358 Other polyuria: Secondary | ICD-10-CM | POA: Diagnosis not present

## 2015-07-13 DIAGNOSIS — E663 Overweight: Secondary | ICD-10-CM | POA: Diagnosis not present

## 2015-07-13 DIAGNOSIS — Z125 Encounter for screening for malignant neoplasm of prostate: Secondary | ICD-10-CM | POA: Diagnosis not present

## 2015-07-13 DIAGNOSIS — E784 Other hyperlipidemia: Secondary | ICD-10-CM | POA: Diagnosis not present

## 2015-07-13 DIAGNOSIS — Z0000 Encounter for general adult medical examination without abnormal findings: Secondary | ICD-10-CM | POA: Diagnosis not present

## 2015-09-13 DIAGNOSIS — D3131 Benign neoplasm of right choroid: Secondary | ICD-10-CM | POA: Diagnosis not present

## 2015-09-13 DIAGNOSIS — H353131 Nonexudative age-related macular degeneration, bilateral, early dry stage: Secondary | ICD-10-CM | POA: Diagnosis not present

## 2015-10-08 DIAGNOSIS — L570 Actinic keratosis: Secondary | ICD-10-CM | POA: Diagnosis not present

## 2015-10-08 DIAGNOSIS — L820 Inflamed seborrheic keratosis: Secondary | ICD-10-CM | POA: Diagnosis not present

## 2016-07-08 DIAGNOSIS — Z125 Encounter for screening for malignant neoplasm of prostate: Secondary | ICD-10-CM | POA: Diagnosis not present

## 2016-07-08 DIAGNOSIS — R358 Other polyuria: Secondary | ICD-10-CM | POA: Diagnosis not present

## 2016-07-08 DIAGNOSIS — E784 Other hyperlipidemia: Secondary | ICD-10-CM | POA: Diagnosis not present

## 2016-07-18 DIAGNOSIS — E663 Overweight: Secondary | ICD-10-CM | POA: Diagnosis not present

## 2016-07-18 DIAGNOSIS — E784 Other hyperlipidemia: Secondary | ICD-10-CM | POA: Diagnosis not present

## 2016-07-18 DIAGNOSIS — Z1389 Encounter for screening for other disorder: Secondary | ICD-10-CM | POA: Diagnosis not present

## 2016-07-18 DIAGNOSIS — Z0000 Encounter for general adult medical examination without abnormal findings: Secondary | ICD-10-CM | POA: Diagnosis not present

## 2016-07-18 DIAGNOSIS — Z23 Encounter for immunization: Secondary | ICD-10-CM | POA: Diagnosis not present

## 2016-07-18 DIAGNOSIS — R51 Headache: Secondary | ICD-10-CM | POA: Diagnosis not present

## 2016-07-21 DIAGNOSIS — Z1212 Encounter for screening for malignant neoplasm of rectum: Secondary | ICD-10-CM | POA: Diagnosis not present

## 2016-09-21 DIAGNOSIS — I2510 Atherosclerotic heart disease of native coronary artery without angina pectoris: Secondary | ICD-10-CM

## 2016-09-21 DIAGNOSIS — I2119 ST elevation (STEMI) myocardial infarction involving other coronary artery of inferior wall: Secondary | ICD-10-CM | POA: Diagnosis present

## 2016-09-21 DIAGNOSIS — Z9861 Coronary angioplasty status: Secondary | ICD-10-CM

## 2016-09-21 HISTORY — DX: Atherosclerotic heart disease of native coronary artery without angina pectoris: I25.10

## 2016-09-22 ENCOUNTER — Inpatient Hospital Stay (HOSPITAL_COMMUNITY): Payer: BLUE CROSS/BLUE SHIELD

## 2016-09-22 ENCOUNTER — Encounter (HOSPITAL_COMMUNITY): Payer: Self-pay | Admitting: *Deleted

## 2016-09-22 ENCOUNTER — Inpatient Hospital Stay (HOSPITAL_COMMUNITY)
Admission: AD | Admit: 2016-09-22 | Discharge: 2016-09-24 | DRG: 250 | Disposition: A | Discharge status: Home / Self Care | Payer: BLUE CROSS/BLUE SHIELD | Attending: Interventional Cardiology | Admitting: Interventional Cardiology

## 2016-09-22 ENCOUNTER — Encounter (HOSPITAL_COMMUNITY)
Admission: AD | Disposition: A | Discharge status: Home / Self Care | Payer: Self-pay | Source: Home / Self Care | Attending: Interventional Cardiology

## 2016-09-22 DIAGNOSIS — E7800 Pure hypercholesterolemia, unspecified: Secondary | ICD-10-CM | POA: Diagnosis not present

## 2016-09-22 DIAGNOSIS — I472 Ventricular tachycardia: Secondary | ICD-10-CM | POA: Diagnosis not present

## 2016-09-22 DIAGNOSIS — I340 Nonrheumatic mitral (valve) insufficiency: Secondary | ICD-10-CM | POA: Diagnosis not present

## 2016-09-22 DIAGNOSIS — I213 ST elevation (STEMI) myocardial infarction of unspecified site: Secondary | ICD-10-CM | POA: Diagnosis not present

## 2016-09-22 DIAGNOSIS — R011 Cardiac murmur, unspecified: Secondary | ICD-10-CM | POA: Diagnosis present

## 2016-09-22 DIAGNOSIS — I2510 Atherosclerotic heart disease of native coronary artery without angina pectoris: Secondary | ICD-10-CM

## 2016-09-22 DIAGNOSIS — R001 Bradycardia, unspecified: Secondary | ICD-10-CM | POA: Diagnosis not present

## 2016-09-22 DIAGNOSIS — I77810 Thoracic aortic ectasia: Secondary | ICD-10-CM | POA: Diagnosis present

## 2016-09-22 DIAGNOSIS — Z87891 Personal history of nicotine dependence: Secondary | ICD-10-CM

## 2016-09-22 DIAGNOSIS — E785 Hyperlipidemia, unspecified: Secondary | ICD-10-CM | POA: Diagnosis present

## 2016-09-22 DIAGNOSIS — I2119 ST elevation (STEMI) myocardial infarction involving other coronary artery of inferior wall: Principal | ICD-10-CM

## 2016-09-22 DIAGNOSIS — Z9861 Coronary angioplasty status: Secondary | ICD-10-CM

## 2016-09-22 DIAGNOSIS — I2111 ST elevation (STEMI) myocardial infarction involving right coronary artery: Secondary | ICD-10-CM | POA: Diagnosis not present

## 2016-09-22 DIAGNOSIS — E784 Other hyperlipidemia: Secondary | ICD-10-CM | POA: Diagnosis not present

## 2016-09-22 DIAGNOSIS — Z8249 Family history of ischemic heart disease and other diseases of the circulatory system: Secondary | ICD-10-CM

## 2016-09-22 DIAGNOSIS — Z803 Family history of malignant neoplasm of breast: Secondary | ICD-10-CM | POA: Diagnosis not present

## 2016-09-22 DIAGNOSIS — I350 Nonrheumatic aortic (valve) stenosis: Secondary | ICD-10-CM | POA: Clinically undetermined

## 2016-09-22 DIAGNOSIS — I5031 Acute diastolic (congestive) heart failure: Secondary | ICD-10-CM | POA: Diagnosis not present

## 2016-09-22 DIAGNOSIS — I700 Atherosclerosis of aorta: Secondary | ICD-10-CM | POA: Diagnosis not present

## 2016-09-22 DIAGNOSIS — I959 Hypotension, unspecified: Secondary | ICD-10-CM | POA: Diagnosis present

## 2016-09-22 DIAGNOSIS — R079 Chest pain, unspecified: Secondary | ICD-10-CM | POA: Diagnosis not present

## 2016-09-22 DIAGNOSIS — I2129 ST elevation (STEMI) myocardial infarction involving other sites: Secondary | ICD-10-CM | POA: Diagnosis not present

## 2016-09-22 HISTORY — PX: TRANSTHORACIC ECHOCARDIOGRAM: SHX275

## 2016-09-22 HISTORY — DX: Atherosclerotic heart disease of native coronary artery without angina pectoris: I25.10

## 2016-09-22 HISTORY — DX: Coronary angioplasty status: Z98.61

## 2016-09-22 HISTORY — DX: ST elevation (STEMI) myocardial infarction involving other coronary artery of inferior wall: I21.19

## 2016-09-22 HISTORY — PX: CORONARY BALLOON ANGIOPLASTY: CATH118233

## 2016-09-22 HISTORY — PX: LEFT HEART CATH AND CORONARY ANGIOGRAPHY: CATH118249

## 2016-09-22 LAB — BASIC METABOLIC PANEL
Anion gap: 8 (ref 5–15)
BUN: 16 mg/dL (ref 6–20)
CHLORIDE: 106 mmol/L (ref 101–111)
CO2: 25 mmol/L (ref 22–32)
Calcium: 9.2 mg/dL (ref 8.9–10.3)
Creatinine, Ser: 0.71 mg/dL (ref 0.61–1.24)
GFR calc non Af Amer: >60 mL/min (ref >60)
Glucose, Bld: 105 mg/dL — ABNORMAL HIGH (ref 65–99)
POTASSIUM: 4.3 mmol/L (ref 3.5–5.1)
SODIUM: 139 mmol/L (ref 135–145)

## 2016-09-22 LAB — COMPREHENSIVE METABOLIC PANEL
ALK PHOS: 58 U/L (ref 38–126)
ALT: 31 U/L (ref 17–63)
AST: 29 U/L (ref 15–41)
Albumin: 3.7 g/dL (ref 3.5–5.0)
Anion gap: 9 (ref 5–15)
BILIRUBIN TOTAL: 0.7 mg/dL (ref 0.3–1.2)
BUN: 17 mg/dL (ref 6–20)
CALCIUM: 9.5 mg/dL (ref 8.9–10.3)
CO2: 24 mmol/L (ref 22–32)
CREATININE: 0.76 mg/dL (ref 0.61–1.24)
Chloride: 105 mmol/L (ref 101–111)
Glucose, Bld: 107 mg/dL — ABNORMAL HIGH (ref 65–99)
Potassium: 4 mmol/L (ref 3.5–5.1)
Sodium: 138 mmol/L (ref 135–145)
TOTAL PROTEIN: 6.2 g/dL — AB (ref 6.5–8.1)

## 2016-09-22 LAB — CBC
HCT: 37.4 % — ABNORMAL LOW (ref 39.0–52.0)
HCT: 36.8 % — ABNORMAL LOW (ref 39.0–52.0)
HEMATOCRIT: 38.9 % — AB (ref 39.0–52.0)
HEMOGLOBIN: 12.9 g/dL — AB (ref 13.0–17.0)
HEMOGLOBIN: 12.3 g/dL — AB (ref 13.0–17.0)
HEMOGLOBIN: 12 g/dL — AB (ref 13.0–17.0)
MCH: 29.7 pg (ref 26.0–34.0)
MCH: 29.8 pg (ref 26.0–34.0)
MCH: 29.7 pg (ref 26.0–34.0)
MCHC: 33.2 g/dL (ref 30.0–36.0)
MCHC: 32.9 g/dL (ref 30.0–36.0)
MCHC: 32.6 g/dL (ref 30.0–36.0)
MCV: 89.4 fL (ref 78.0–100.0)
MCV: 90.6 fL (ref 78.0–100.0)
MCV: 91.1 fL (ref 78.0–100.0)
PLATELETS: 168 10*3/uL (ref 150–400)
Platelets: 182 10*3/uL (ref 150–400)
Platelets: 157 10*3/uL (ref 150–400)
RBC: 4.35 MIL/uL (ref 4.22–5.81)
RBC: 4.13 MIL/uL — ABNORMAL LOW (ref 4.22–5.81)
RBC: 4.04 MIL/uL — AB (ref 4.22–5.81)
RDW: 13.2 % (ref 11.5–15.5)
RDW: 13.3 % (ref 11.5–15.5)
RDW: 13.4 % (ref 11.5–15.5)
WBC: 8.3 10*3/uL (ref 4.0–10.5)
WBC: 7.3 10*3/uL (ref 4.0–10.5)
WBC: 8.3 10*3/uL (ref 4.0–10.5)

## 2016-09-22 LAB — POCT I-STAT, CHEM 8
BUN: 19 mg/dL (ref 6–20)
CREATININE: 0.7 mg/dL (ref 0.61–1.24)
Calcium, Ion: 1.33 mmol/L (ref 1.15–1.40)
Chloride: 102 mmol/L (ref 101–111)
GLUCOSE: 114 mg/dL — AB (ref 65–99)
HEMATOCRIT: 38 % — AB (ref 39.0–52.0)
Hemoglobin: 12.9 g/dL — ABNORMAL LOW (ref 13.0–17.0)
POTASSIUM: 4.1 mmol/L (ref 3.5–5.1)
Sodium: 139 mmol/L (ref 135–145)
TCO2: 26 mmol/L (ref 0–100)

## 2016-09-22 LAB — ECHOCARDIOGRAM COMPLETE
HEIGHTINCHES: 70 in
WEIGHTICAEL: 3264 [oz_av]

## 2016-09-22 LAB — TROPONIN I
TROPONIN I: 0.17 ng/mL — AB (ref <0.03)
TROPONIN I: 35.32 ng/mL — AB (ref <0.03)
Troponin I: 22.94 ng/mL (ref <0.03)
Troponin I: 30.27 ng/mL (ref <0.03)

## 2016-09-22 LAB — LIPID PANEL
CHOLESTEROL: 184 mg/dL (ref 0–200)
HDL: 46 mg/dL (ref >40)
LDL Cholesterol: 126 mg/dL — ABNORMAL HIGH (ref 0–99)
TRIGLYCERIDES: 62 mg/dL (ref <150)
Total CHOL/HDL Ratio: 4 RATIO
VLDL: 12 mg/dL (ref 0–40)

## 2016-09-22 LAB — PROTIME-INR
INR: 1.04
PROTHROMBIN TIME: 13.6 s (ref 11.4–15.2)

## 2016-09-22 LAB — POCT ACTIVATED CLOTTING TIME
Activated Clotting Time: 279 seconds
Activated Clotting Time: 428 seconds

## 2016-09-22 LAB — MRSA PCR SCREENING: MRSA by PCR: NEGATIVE

## 2016-09-22 LAB — APTT: aPTT: 52 seconds — ABNORMAL HIGH (ref 24–36)

## 2016-09-22 SURGERY — LEFT HEART CATH AND CORONARY ANGIOGRAPHY
Anesthesia: LOCAL

## 2016-09-22 MED ORDER — TICAGRELOR 90 MG PO TABS
ORAL_TABLET | ORAL | Status: AC
  Filled 2016-09-22: qty 2

## 2016-09-22 MED ORDER — ONDANSETRON HCL 4 MG/2ML IJ SOLN: 4.0000 mg | Freq: Four times a day (QID) | INTRAMUSCULAR | Status: DC | PRN

## 2016-09-22 MED ORDER — SODIUM CHLORIDE 0.9% FLUSH: 3.0000 mL | INTRAVENOUS | Status: DC | PRN

## 2016-09-22 MED ORDER — CLOPIDOGREL BISULFATE 75 MG PO TABS
75.0000 mg | ORAL_TABLET | Freq: Every day | ORAL | Status: DC
  Administered 2016-09-23 – 2016-09-24 (×2): 75 mg via ORAL
  Filled 2016-09-22 (×3): qty 1

## 2016-09-22 MED ORDER — OXYCODONE-ACETAMINOPHEN 5-325 MG PO TABS
1.0000 | ORAL_TABLET | ORAL | Status: DC | PRN
  Administered 2016-09-22 – 2016-09-23 (×3): 1 via ORAL
  Filled 2016-09-22 (×3): qty 1

## 2016-09-22 MED ORDER — HEPARIN SODIUM (PORCINE) 1000 UNIT/ML IJ SOLN
INTRAMUSCULAR | Status: AC
  Filled 2016-09-22: qty 1

## 2016-09-22 MED ORDER — ATROPINE SULFATE 1 MG/10ML IJ SOSY
PREFILLED_SYRINGE | INTRAMUSCULAR | Status: DC | PRN
  Administered 2016-09-22: 1 mg via INTRAVENOUS

## 2016-09-22 MED ORDER — LIDOCAINE HCL (PF) 1 % IJ SOLN
INTRAMUSCULAR | Status: AC
  Filled 2016-09-22: qty 30

## 2016-09-22 MED ORDER — HYDRALAZINE HCL 20 MG/ML IJ SOLN: 5.0000 mg | INTRAMUSCULAR | Status: AC | PRN

## 2016-09-22 MED ORDER — NITROGLYCERIN 1 MG/10 ML FOR IR/CATH LAB
INTRA_ARTERIAL | Status: AC
  Filled 2016-09-22: qty 10

## 2016-09-22 MED ORDER — ATORVASTATIN CALCIUM 80 MG PO TABS
80.0000 mg | ORAL_TABLET | Freq: Every day | ORAL | Status: DC
  Administered 2016-09-22 – 2016-09-23 (×2): 80 mg via ORAL
  Filled 2016-09-22 (×2): qty 1

## 2016-09-22 MED ORDER — TIROFIBAN (AGGRASTAT) BOLUS VIA INFUSION
INTRAVENOUS | Status: DC | PRN
  Administered 2016-09-22: 2000 ug via INTRAVENOUS

## 2016-09-22 MED ORDER — LABETALOL HCL 5 MG/ML IV SOLN: 10.0000 mg | INTRAVENOUS | Status: AC | PRN

## 2016-09-22 MED ORDER — VERAPAMIL HCL 2.5 MG/ML IV SOLN
INTRAVENOUS | Status: DC | PRN
  Administered 2016-09-22: 10 mL via INTRA_ARTERIAL

## 2016-09-22 MED ORDER — HEPARIN SODIUM (PORCINE) 1000 UNIT/ML IJ SOLN
INTRAMUSCULAR | Status: DC | PRN
  Administered 2016-09-22: 2000 [IU] via INTRAVENOUS
  Administered 2016-09-22: 7000 [IU] via INTRAVENOUS

## 2016-09-22 MED ORDER — LIDOCAINE HCL (PF) 1 % IJ SOLN
INTRAMUSCULAR | Status: DC | PRN
  Administered 2016-09-22: 2 mL

## 2016-09-22 MED ORDER — TIROFIBAN HCL IN NACL 5-0.9 MG/100ML-% IV SOLN
0.1500 ug/kg/min | INTRAVENOUS | Status: AC
  Administered 2016-09-22 (×2): 0.15 ug/kg/min via INTRAVENOUS
  Filled 2016-09-22 (×2): qty 100

## 2016-09-22 MED ORDER — IOPAMIDOL (ISOVUE-370) INJECTION 76%
INTRAVENOUS | Status: AC
  Filled 2016-09-22: qty 125

## 2016-09-22 MED ORDER — IOPAMIDOL (ISOVUE-370) INJECTION 76%
INTRAVENOUS | Status: DC | PRN
  Administered 2016-09-22: 220 mL via INTRA_ARTERIAL

## 2016-09-22 MED ORDER — SODIUM CHLORIDE 0.9% FLUSH
3.0000 mL | Freq: Two times a day (BID) | INTRAVENOUS | Status: DC
  Administered 2016-09-22 – 2016-09-24 (×4): 3 mL via INTRAVENOUS

## 2016-09-22 MED ORDER — SODIUM CHLORIDE 0.9 % IV SOLN: 250.0000 mL | INTRAVENOUS | Status: DC | PRN

## 2016-09-22 MED ORDER — IOPAMIDOL (ISOVUE-370) INJECTION 76%
INTRAVENOUS | Status: AC
  Filled 2016-09-22: qty 50

## 2016-09-22 MED ORDER — ACETAMINOPHEN 325 MG PO TABS
650.0000 mg | ORAL_TABLET | ORAL | Status: DC | PRN
  Administered 2016-09-22 – 2016-09-23 (×2): 650 mg via ORAL
  Filled 2016-09-22 (×2): qty 2

## 2016-09-22 MED ORDER — VERAPAMIL HCL 2.5 MG/ML IV SOLN
INTRAVENOUS | Status: AC
  Filled 2016-09-22: qty 2

## 2016-09-22 MED ORDER — IOPAMIDOL (ISOVUE-370) INJECTION 76%
INTRAVENOUS | Status: AC
  Filled 2016-09-22: qty 100

## 2016-09-22 MED ORDER — MIDAZOLAM HCL 2 MG/2ML IJ SOLN
INTRAMUSCULAR | Status: AC
  Filled 2016-09-22: qty 2

## 2016-09-22 MED ORDER — ASPIRIN 81 MG PO CHEW
81.0000 mg | CHEWABLE_TABLET | Freq: Every day | ORAL | Status: DC
  Administered 2016-09-22 – 2016-09-24 (×3): 81 mg via ORAL
  Filled 2016-09-22 (×3): qty 1

## 2016-09-22 MED ORDER — HEPARIN (PORCINE) IN NACL 2-0.9 UNIT/ML-% IJ SOLN
INTRAMUSCULAR | Status: AC | PRN
  Administered 2016-09-22: 1000 mL

## 2016-09-22 MED ORDER — MIDAZOLAM HCL 2 MG/2ML IJ SOLN
INTRAMUSCULAR | Status: DC | PRN
  Administered 2016-09-22: 1 mg via INTRAVENOUS

## 2016-09-22 MED ORDER — TICAGRELOR 90 MG PO TABS
ORAL_TABLET | ORAL | Status: DC | PRN
  Administered 2016-09-22: 180 mg via ORAL

## 2016-09-22 MED ORDER — HEPARIN SODIUM (PORCINE) 5000 UNIT/ML IJ SOLN
5000.0000 [IU] | Freq: Three times a day (TID) | INTRAMUSCULAR | Status: DC
  Administered 2016-09-22 – 2016-09-23 (×4): 5000 [IU] via SUBCUTANEOUS
  Filled 2016-09-22 (×4): qty 1

## 2016-09-22 MED ORDER — CLOPIDOGREL BISULFATE 75 MG PO TABS
150.0000 mg | ORAL_TABLET | Freq: Once | ORAL | Status: AC
  Administered 2016-09-22: 150 mg via ORAL
  Filled 2016-09-22: qty 2

## 2016-09-22 MED ORDER — TIROFIBAN HCL IV 12.5 MG/250 ML
INTRAVENOUS | Status: AC | PRN
  Administered 2016-09-22: .15 ug/kg/min via INTRAVENOUS

## 2016-09-22 MED ORDER — TIROFIBAN HCL IN NACL 5-0.9 MG/100ML-% IV SOLN
INTRAVENOUS | Status: AC
  Filled 2016-09-22: qty 100

## 2016-09-22 MED ORDER — FENTANYL CITRATE (PF) 100 MCG/2ML IJ SOLN
INTRAMUSCULAR | Status: DC | PRN
  Administered 2016-09-22: 50 ug via INTRAVENOUS

## 2016-09-22 MED ORDER — HEPARIN (PORCINE) IN NACL 2-0.9 UNIT/ML-% IJ SOLN
INTRAMUSCULAR | Status: AC
  Filled 2016-09-22: qty 1000

## 2016-09-22 MED ORDER — FENTANYL CITRATE (PF) 100 MCG/2ML IJ SOLN
INTRAMUSCULAR | Status: AC
Start: 2016-09-22 — End: ?
  Filled 2016-09-22: qty 2

## 2016-09-22 MED ORDER — SODIUM CHLORIDE 0.9 % IV SOLN
INTRAVENOUS | Status: AC
  Administered 2016-09-22: 09:00:00 via INTRAVENOUS

## 2016-09-22 MED ORDER — SODIUM CHLORIDE 0.9 % IV SOLN
INTRAVENOUS | Status: AC | PRN
  Administered 2016-09-22: 200 mL/h via INTRAVENOUS

## 2016-09-22 SURGICAL SUPPLY — 19 items
BALLN EUPHORA RX 2.0X12 (BALLOONS) ×2
BALLN EUPHORA RX 2.75X12 (BALLOONS) ×2
BALLOON EUPHORA RX 2.0X12 (BALLOONS) ×1 IMPLANT
BALLOON EUPHORA RX 2.75X12 (BALLOONS) ×1 IMPLANT
CATH INFINITI 5 FR JL3.5 (CATHETERS) ×2 IMPLANT
CATH VISTA GUIDE 6FR JR4 (CATHETERS) ×2 IMPLANT
COVER PRB 48X5XTLSCP FOLD TPE (BAG) ×1 IMPLANT
COVER PROBE 5X48 (BAG) ×1
DEVICE RAD COMP TR BAND LRG (VASCULAR PRODUCTS) ×2 IMPLANT
ELECT DEFIB PAD ADLT CADENCE (PAD) ×2 IMPLANT
GLIDESHEATH SLEND A-KIT 6F 22G (SHEATH) ×2 IMPLANT
GUIDEWIRE INQWIRE 1.5J.035X260 (WIRE) ×1 IMPLANT
INQWIRE 1.5J .035X260CM (WIRE) ×2
KIT ENCORE 26 ADVANTAGE (KITS) ×2 IMPLANT
KIT HEART LEFT (KITS) ×2 IMPLANT
PACK CARDIAC CATHETERIZATION (CUSTOM PROCEDURE TRAY) ×2 IMPLANT
TRANSDUCER W/STOPCOCK (MISCELLANEOUS) ×2 IMPLANT
TUBING CIL FLEX 10 FLL-RA (TUBING) ×2 IMPLANT
WIRE ASAHI PROWATER 180CM (WIRE) ×2 IMPLANT

## 2016-09-22 NOTE — Progress Notes (Signed)
Progress Note  Patient Name: Marcus Mills Date of Encounter: 09/22/2016  Primary Physician: Stacie Glaze, MD (although he must have a new primary.) Primary Cardiologist: New, Dr. Katrinka Blazing  Patient Profile     71 y.o. male Without known CAD history transferred from [facility] with inferolateral STEMI earlier this morning. He was given aspirin and heparin prior to transfer along with IV Lopressor. Her Dr. Michaelle Copas evaluation the patient had initial onset of symptoms 36 hours prior to presentation waxing and waning symptoms since.  Emergent cardiac catheterization revealed occluded distal RCA/RPAV after the PDA --> PTCA only due to tortuous RCA and poor location for stent placement due to bifurcation with PDA.  Elevated LVEDP consistent with  On Aggrastat for 18 hours  Principal Problem:   ST elevation myocardial infarction (STEMI) of inferolateral wall, initial episode of care [facility]) Active Problems:   Hyperlipidemia with target low density lipoprotein (LDL) cholesterol less than 70 mg/dL   CAD S/P percutaneous coronary angioplasty   Systolic ejection murmur   Bradycardia, sinus   Acute diastolic heart failure (HCC)   Subjective   Doing well. No further chest pain or dyspnea. No bleeding issues at present. No dizziness.  Inpatient Medications    Scheduled Meds: . aspirin  81 mg Oral Daily  . atorvastatin  80 mg Oral q1800  . clopidogrel  150 mg Oral Once  . [START ON 09/23/2016] clopidogrel  75 mg Oral Q breakfast  . heparin  5,000 Units Subcutaneous Q8H  . sodium chloride flush  3 mL Intravenous Q12H   Continuous Infusions: . sodium chloride 125 mL/hr at 09/22/16 0900  . sodium chloride    . tirofiban 0.15 mcg/kg/min (09/22/16 0700)   PRN Meds: sodium chloride, acetaminophen, hydrALAZINE, labetalol, ondansetron (ZOFRAN) IV, oxyCODONE-acetaminophen, sodium chloride flush   Vital Signs    Vitals:   09/22/16 0700 09/22/16 0730 09/22/16 0800 09/22/16 0821    BP: 93/64 100/66 112/62   Pulse: (!) 55 (!) 50 (!) 48   Resp: 18 20 15    Temp:    97.6 F (36.4 C)  TempSrc:    Oral  SpO2: 98% 98% 98%   Weight:      Height:        Intake/Output Summary (Last 24 hours) at 09/22/16 0904 Last data filed at 09/22/16 0700  Gross per 24 hour  Intake           781.48 ml  Output              450 ml  Net           331.48 ml   Filed Weights   09/22/16 0416  Weight: 204 lb (92.5 kg)    Telemetry    Sinus bradycardia with rates in the 40s to 50s. Fusion beats and PVCs noted - Personally Reviewed  ECG    No EKG to review this morning - Personally Reviewed  Physical Exam   Physical Exam  Constitutional: He is oriented to person, place, and time. He appears well-developed and well-nourished. No distress.  HENT:  Head: Normocephalic and atraumatic.  Eyes: Conjunctivae and EOM are normal. Pupils are equal, round, and reactive to light.  Neck: Normal range of motion. Neck supple. No JVD present.  Cardiovascular: Normal rate, regular rhythm and intact distal pulses.  Exam reveals no gallop and no friction rub.   Murmur heard.  Harsh midsystolic murmur is present with a grade of 1/6  at the upper right sternal  border radiating to the neck Pulmonary/Chest: Breath sounds normal. No respiratory distress. He has no wheezes. He has no rales. He exhibits no tenderness.  Abdominal: Soft. Bowel sounds are normal. He exhibits no distension. There is no tenderness. There is no rebound and no guarding.  Musculoskeletal: Normal range of motion. He exhibits no edema or deformity.  Right radial cath site intact. TR band in place  Neurological: He is alert and oriented to person, place, and time. He has normal reflexes.  Skin: Skin is warm and dry. No rash noted. No erythema. No pallor.  Psychiatric: He has a normal mood and affect. His behavior is normal.  Nursing note and vitals reviewed.   Labs    Chemistry  Recent Labs Lab 09/22/16 0241 09/22/16 0245  09/22/16 0549  NA 139 138 139  K 4.1 4.0 4.3  CL 102 105 106  CO2  --  24 25  GLUCOSE 114* 107* 105*  BUN 19 17 16   CREATININE 0.70 0.76 0.71  CALCIUM  --  9.5 9.2  PROT  --  6.2*  --   ALBUMIN  --  3.7  --   AST  --  29  --   ALT  --  31  --   ALKPHOS  --  58  --   BILITOT  --  0.7  --   GFRNONAA  --  >60 >60  GFRAA  --  >60 >60  ANIONGAP  --  9 8     Hematology  Recent Labs Lab 09/22/16 0241 09/22/16 0245 09/22/16 0549  WBC  --  8.3 7.3  RBC  --  4.35 4.13*  HGB 12.9* 12.9* 12.3*  HCT 38.0* 38.9* 37.4*  MCV  --  89.4 90.6  MCH  --  29.7 29.8  MCHC  --  33.2 32.9  RDW  --  13.2 13.3  PLT  --  182 168    Cardiac Enzymes  Recent Labs Lab 09/22/16 0245 09/22/16 0549  TROPONINI 0.17* 22.94*   No results for input(s): TROPIPOC in the last 168 hours.   BNPNo results for input(s): BNP, PROBNP in the last 168 hours.   DDimer No results for input(s): DDIMER in the last 168 hours.   Radiology    No results found.  Cardiac Studies   Cardiac Cath-PTCA 09/22/2016 Acute inferolateral ST elevation myocardial infarction with initial onset of symptoms 36 hours ago followed by waxing and waning chest discomfort.  Successful PTCA of the continuation of the right coronary beyond the PDA reducing 100% stenosis to less than 30% with TIMI grade 3 flow. Stent implantation was not performed because of unfavorable lesion characteristics/location.  50% proximal to mid LAD stenosis and 50% distal RCA stenosis before the PDA. Left main is widely patent.  Right dominant coronary anatomy.  Elevated LVEDP at 20 mmHg is consistent with acute diastolic heart failure. RECOMMENDATIONS:  Aggrastat 18 hours.  Aspirin and Plavix for 1 year.  High intensity statin therapy. Beta blocker therapy as tolerated.  2-D Doppler echocardiogram to assess systolic function  Diagnostic [id number]/8/11    Post-Intervention Diagram - PTCA of rPAV      Assessment & Plan    Principal Problem:    ST elevation myocardial infarction (STEMI) of inferolateral wall, initial episode of care (HCC) -->CAD S/P percutaneous coronary angioplasty; troponin elevation noted. Not unexpected.  Status post PTCA only of RPAV -> plan is to continue Aggrastat to complete 18 hours.  DAPT for minimum of 3 months, but  Plavix for 1 year  Started on high-dose statin  Currently no blood pressure room for beta blocker or ACE inhibitor/ARB. Also had bradycardia no beta blocker.  Echo pending.    Hyperlipidemia with target low density lipoprotein (LDL) cholesterol less than 70 mg/dL - high-dose statin    Bradycardia, sinus - unable use beta blockers at present; no sign of heart block.    Acute diastolic heart failure (HCC) - echo pending. Elevated LVEDP on cath, but no LV gram performed.  Currently borderline hypotensive with no blood pressure room for afterload reduction for beta blocker.  We will wait for results of echo  At present he seems relatively euvolemic. Hold off on diuretic    Systolic murmur: 2-D echo pending. Sounds consistent with either aortic sclerosis or stenosis.  Monitor in ICU today. If stable, consider transfer to stepdown or telemetry.   Signed, Bryan Lemmaavid Harding, MD  09/22/2016, 9:04 AM

## 2016-09-22 NOTE — Progress Notes (Signed)
CARDIAC REHAB PHASE I   PRE:  Rate/Rhythm: 45 SB  BP:  Sitting: 110/77        SaO2: 95 RA  Pt in bed, states he is very tired, states he was told by Dr. Herbie BaltimoreHarding not to get out of bed today. Began MI/PCI education with pt and wife at bedside. Reviewed risk factors, MI book, and anti-platelet therapy. Left heart healthy diet handout for pt to review. Pt and wife verbalized understanding. Pt received visitor, will follow-up tomorrow. Pt in bed, call bell within reach.   [phone] Joylene GrapesEmily C Mairely Foxworth, RN, BSN 09/22/2016 2:49 PM

## 2016-09-22 NOTE — Progress Notes (Signed)
Critical troponin of 22.94. MD Herbie BaltimoreHarding notified. Pt remains cp free. Will continue to monitor.

## 2016-09-22 NOTE — H&P (Addendum)
CARDIOLOGY INPATIENT HISTORY AND PHYSICAL EXAMINATION NOTE    The patient has been seen in conjunction with Marcus Mills.MD All aspects of care have been considered and discussed. The patient has been personally interviewed, examined, and all clinical data has been reviewed.   The patient was met in the ambulance bay after transport by [facility]. He was having ongoing mild chest discomfort with persistent inferolateral ST elevation. Symptoms have waxed and waned for 24-36 hours. An episode of syncope occurred earlier on the day of presentation.  On exam the patient was in no acute distress. Vital signs were stable. Pulses were 2+ and symmetric in the radial and posterior tibial region bilaterally. Cardiac exam was on remarkable. Lungs are clear. Neck veins were not elevated.  Acute inferolateral ST elevation myocardial infarction with ongoing mild pain raising suspicion of partial reperfusion or infarct territory receiving collateral flow.  Emergency angiography to determine anatomy and help guide therapy in this patient who is previously healthy.  31 minutes spent in process of acute care.  Patient ID: Marcus Mills MRN: [id number], DOB/AGE: 1945/05/12   Admit date: 09/22/2016   Primary Physician: Ricard Dillon, MD Primary Cardiologist: new  Reason for admission: chest pain  HPI: This is a 71 y.o.white male without known history CAD presented with chest pain to [facility] ED.  Pt was in usual state of health when in the morning he experienced chest pain. That resolved spontaneously.  He again had another episode of chest pain aroudn 12:30 AM tonight which progressively got worse. It was pressure like, non radiating, non positional, 10/10 in intensity and improved with NTG. Associated with diaphoresis.  He was given asa 324 mg, 4000 IU of IV heparin. He was also given IV lopressor 5 mg.  He went to [place] ED and was transferred for emergent cath to [facility].   Problem  List: Past Medical History:  Diagnosis Date  . ALLERGIC RHINITIS 11/04/2006  . HYPERLIPIDEMIA 11/04/2006    Past Surgical History:  Procedure Laterality Date  . TONSILLECTOMY       Allergies: No Known Allergies   Home Medications No current facility-administered medications for this encounter.      Family History  Problem Relation Age of Onset  . Hypertension Mother   . Cancer Mother   . Cancer Father   . Breast cancer Sister      Social History   Social History  . Marital status: Married    Spouse name: N/A  . Number of children: N/A  . Years of education: N/A   Occupational History  . salesman    Social History Main Topics  . Smoking status: Former Smoker    Quit date: 04/26/1987  . Smokeless tobacco: Not on file  . Alcohol use 0.5 oz/week    1 drink(s) per week  . Drug use: No  . Sexual activity: Yes   Other Topics Concern  . Not on file   Social History Narrative  . No narrative on file     Review of Systems: General: negative for chills, fever, night sweats or weight changes.  Cardiovascular: chest pain negative for dyspnea on exertion, edema, orthopnea, palpitations, paroxysmal nocturnal dyspnea or shortness of breath  Dermatological: negative for rash Respiratory: negative for cough or wheezing Urologic: negative for hematuria Abdominal: negative for nausea, vomiting, diarrhea, bright red blood per rectum, melena, or hematemesis Neurologic: negative for visual changes, syncope, or dizziness Endocrine: no diabetes, no hypothyroidism Immunological: no lymph adenopathy Psych: non  homicidal/suicidal  Physical Exam: Vitals: There were no vitals taken for this visit. General: not in acute distress Neck: JVP flat, neck supple Heart: regular rate and rhythm, S1, S2, no murmurs  Lungs: CTAB  GI: non tender, non distended, bowel sounds present Extremities: no edema Neuro: AAO x 3  Psych: normal affect, no anxiety   Labs:  No results found for  this or any previous visit (from the past 24 hour(s)).   Radiology/Studies: No results found.  EKG: sinus bradycardia with inferior ST elevation and reciprocal changes int he anterior leads  Echo: none  Cardiac cath: none  Medical decision making:  Discussed care with the patient Discussed care with the physician on the phone Reviewed labs and imaging personally Reviewed prior records  ASSESSMENT AND PLAN:  This is a 71 y.o. male without known CAD but history of hypercholesterolemia presented with chest pain and inferior STEMI on the ECG to [place] ED.    Active Problems:   Other hyperlipidemia   STEMI involving oth coronary artery of inferior wall (HCC)  Inferior STEMI, hemodynamically stable 1. Emergent cardiac catheterization and PCI 2. Risk stratify with HbA1c, lipid profile, TSH 3. Echocardiogram in the morning 4. Aspirin, antiplatelet, high dose statin and beta blocker if tolerated 5. Medication compliance emphasized to the patient   Hyperlipidemia, unknown type - high dose statin and lipid panel    Signed, Flossie Dibble, MD MS 09/22/2016, 2:26 AM

## 2016-09-22 NOTE — Progress Notes (Signed)
  Echocardiogram 2D Echocardiogram has been performed.  Janalyn HarderWest, Marianna Cid R 09/22/2016, 1:54 PM

## 2016-09-23 DIAGNOSIS — Z9861 Coronary angioplasty status: Secondary | ICD-10-CM

## 2016-09-23 LAB — HEMOGLOBIN A1C
HEMOGLOBIN A1C: 5.4 % (ref 4.8–5.6)
MEAN PLASMA GLUCOSE: 108 mg/dL

## 2016-09-23 LAB — GLUCOSE, CAPILLARY: GLUCOSE-CAPILLARY: 93 mg/dL (ref 65–99)

## 2016-09-23 MED ORDER — DOCUSATE SODIUM 100 MG PO CAPS
100.0000 mg | ORAL_CAPSULE | Freq: Every day | ORAL | Status: DC | PRN
  Administered 2016-09-23: 100 mg via ORAL
  Filled 2016-09-23: qty 1

## 2016-09-23 MED ORDER — BISACODYL 5 MG PO TBEC
5.0000 mg | DELAYED_RELEASE_TABLET | Freq: Every day | ORAL | Status: DC | PRN
  Administered 2016-09-23: 5 mg via ORAL
  Filled 2016-09-23: qty 1

## 2016-09-23 MED ORDER — IRBESARTAN 75 MG PO TABS
37.5000 mg | ORAL_TABLET | Freq: Every day | ORAL | Status: DC
  Administered 2016-09-23 – 2016-09-24 (×2): 37.5 mg via ORAL
  Filled 2016-09-23: qty 1
  Filled 2016-09-23: qty 0.5

## 2016-09-23 NOTE — Progress Notes (Signed)
Report called to receiving RN [phone]. Patient with no complaints at the current time. Will transfer via WC.

## 2016-09-23 NOTE — Care Management Note (Signed)
Case Management Note Donn PieriniKristi Kessa Fairbairn RN, BSN Unit 2W-Case Manager-- 2H coverage [phone]  Patient Details  Name: Marcus JourneyJames L Wagner Jr MRN: [id number] Date of Birth: 10/28/1945  Subjective/Objective:   Pt admitted with STEMI s/p PCI                 Action/Plan: PTA pt lived at home with wife- anticipate return home- PCP- Darryll Capersjohn Jenkins, pt has [employer] insurance- noted Plavix/ASA ordered- CM to follow for d/c needs  Expected Discharge Date:                  Expected Discharge Plan:  Home/Self Care  In-House Referral:     Discharge planning Services  CM Consult  Post Acute Care Choice:    Choice offered to:     DME Arranged:    DME Agency:     HH Arranged:    HH Agency:     Status of Service:  In process, will continue to follow  If discussed at Long Length of Stay Meetings, dates discussed:    Discharge Disposition:   Additional Comments:  Darrold SpanWebster, Rojean Ige Hall, RN 09/23/2016, 11:23 AM

## 2016-09-23 NOTE — Discharge Summary (Signed)
Discharge Summary    Patient ID: Marcus Mills,  MRN: [id number], DOB/AGE: 69/10/1945 69 y.o.  Admit date: 09/22/2016 Discharge date: 09/24/2016  Primary Care Provider: Stacie GlazeJenkins, John E Primary Cardiologist: Herbie BaltimoreHarding  Discharge Diagnoses    Principal Problem:   ST elevation myocardial infarction (STEMI) of inferolateral wall, initial episode of care [facility](HCC) Active Problems:   Hyperlipidemia with target low density lipoprotein (LDL) cholesterol less than 70 mg/dL   CAD S/P percutaneous coronary angioplasty   Bradycardia, sinus   Acute diastolic heart failure (HCC)   Systolic ejection murmur   Allergies No Known Allergies  Diagnostic Studies/Procedures    LHC: 09/22/16  Conclusion    Acute inferolateral ST elevation myocardial infarction with initial onset of symptoms 36 hours ago followed by waxing and waning chest discomfort.  Successful PTCA of the continuation of the right coronary beyond the PDA reducing 100% stenosis to less than 30% with TIMI grade 3 flow. Stent implantation was not performed because of unfavorable lesion characteristics/location.  50% proximal to mid LAD stenosis and 50% distal RCA stenosis before the PDA. Left main is widely patent.  Right dominant coronary anatomy.  Elevated LVEDP at 20 mmHg is consistent with acute diastolic heart failure.  RECOMMENDATIONS:   Aggrastat 18 hours.  Aspirin and Plavix for 1 year.  High intensity statin therapy.  Beta blocker therapy as tolerated.  2-D Doppler echocardiogram to assess systolic function   TTE: 09/22/16  Study Conclusions  - Left ventricle: The cavity size was normal. Wall thickness was   increased in a pattern of mild LVH. There was mild focal basal   hypertrophy of the septum. Systolic function was normal. The   estimated ejection fraction was in the range of 55% to 60%. There   is akinesis of the basal-midinferolateral myocardium. Features   are consistent with a pseudonormal  left ventricular filling   pattern, with concomitant abnormal relaxation and increased   filling pressure (grade 2 diastolic dysfunction). - Aortic valve: Valve mobility was restricted. There was mild   stenosis. Valve area (VTI): 1.51 cm^2. Valve area (Vmax): 1.54   cm^2. Valve area (Vmean): 1.48 cm^2. - Aortic root: The aortic root was mildly dilated. - Mitral valve: Calcified annulus. There was mild regurgitation.  Impressions:  - Akinesis of the inferolateral wall with overall preserved LV   systolic function; mild LVH; moderate diastolic dysfunction;   calcified aortic valve with mild AS (mean gradient 12 mmHg);   mildly dilated aortic root; mild MR _____________   History of Present Illness     71 yo male with no known hx of CAD who [facility] with chest pain. EKG there showed acute inferolateral ST elevation. He was given asa 324 mg, 4000 IU of IV heparin. He was also given IV lopressor 5 mg. [facility].   Hospital Course     Underwent LHC with Dr. Katrinka BlazingSmith noted above with occluded distal RCA/RPAV after the PDA -->PTCA only due to tortuous RCA and poor location for stent placement due to bifurcation with PDA. He was continued on aggrastat for 18 hours post cath. Trop peaked at 35.32. Plan for DAPT with ASA/plavix for at least 3 months. Blood pressures were noted soft, but able to add low dose Avapro. Unable to add BB due to bradycardia. Also started on high dose statin. LDL 126. Hgb A1c 5.4. Echo note normal EF with mild AS. He was transferred to telemetry on 09/23/16. Worked  well with cardiac rehab.   He was seen by Dr. Herbie Baltimore and determined stable for discharge home. Follow up has been arranged in the office. Medication are listed below.   General: Well developed, well nourished, male appearing in no acute distress. Head: Normocephalic, atraumatic.  Neck: Supple without bruits, JVD. Lungs:  Resp regular and unlabored,  CTA. Heart: RRR, S1, S2, no S3, S4, 2/6 systolic murmur; no rub. Abdomen: Soft, non-tender, non-distended with normoactive bowel sounds. No hepatomegaly. No rebound/guarding. No obvious abdominal masses. Extremities: No clubbing, cyanosis, edema. Distal pedal pulses are 2+ bilaterally. R radial cath site stable without bruising or hematoma Neuro: Alert and oriented X 3. Moves all extremities spontaneously. Psych: Normal affect.  Of note patient wants to follow long term with Dr. Herbie Baltimore, but APP follow up was arranged at [facility] as there were no appts available at [facility].  _____________  Discharge Vitals Blood pressure 119/61, pulse (!) 57, temperature 99.2 F (37.3 C), temperature source Oral, resp. rate 16, height 5\' 10"  (1.778 m), weight 198 lb 11.2 oz (90.1 kg), SpO2 95 %.  Filed Weights   09/22/16 0416 09/24/16 0558  Weight: 204 lb (92.5 kg) 198 lb 11.2 oz (90.1 kg)    Labs & Radiologic Studies    CBC  Recent Labs  09/22/16 0549 09/22/16 0917  WBC 7.3 8.3  HGB 12.3* 12.0*  HCT 37.4* 36.8*  MCV 90.6 91.1  PLT 168 157   Basic Metabolic Panel  Recent Labs  09/22/16 0245 09/22/16 0549  NA 138 139  K 4.0 4.3  CL 105 106  CO2 24 25  GLUCOSE 107* 105*  BUN 17 16  CREATININE 0.76 0.71  CALCIUM 9.5 9.2   Liver Function Tests  Recent Labs  09/22/16 0245  AST 29  ALT 31  ALKPHOS 58  BILITOT 0.7  PROT 6.2*  ALBUMIN 3.7   No results for input(s): LIPASE, AMYLASE in the last 72 hours. Cardiac Enzymes  Recent Labs  09/22/16 0549 09/22/16 0917 09/22/16 1540  TROPONINI 22.94* 30.27* 35.32*   BNP Invalid input(s): POCBNP D-Dimer No results for input(s): DDIMER in the last 72 hours. Hemoglobin A1C  Recent Labs  09/22/16 0245  HGBA1C 5.4   Fasting Lipid Panel  Recent Labs  09/22/16 0245  CHOL 184  HDL 46  LDLCALC 126*  TRIG 62  CHOLHDL 4.0   Thyroid Function Tests No results for input(s): TSH, T4TOTAL, T3FREE, THYROIDAB in the last  72 hours.  Invalid input(s): FREET3 _____________  No results found. Disposition   Pt is being discharged home today in good condition.  Follow-up Plans & Appointments    Follow-up Information    [place], Sharrell Ku, [place] Follow up on 09/30/2016.   Specialty:  Cardiology Why:  at 8am for your follow up appt.  Contact information: [address] [place] [address] [phone]          Discharge Instructions    Amb Referral to Cardiac Rehabilitation    Complete by:  As directed    Diagnosis:   STEMI Comment - to [facility]     Call MD for:  redness, tenderness, or signs of infection (pain, swelling, redness, odor or green/yellow discharge around incision site)    Complete by:  As directed    Diet - low sodium heart healthy    Complete by:  As directed    Discharge instructions    Complete by:  As directed    Radial Site Care Refer  to this sheet in the next few weeks. These instructions provide you with information on caring for yourself after your procedure. Your caregiver may also give you more specific instructions. Your treatment has been planned according to current medical practices, but problems sometimes occur. Call your caregiver if you have any problems or questions after your procedure. HOME CARE INSTRUCTIONS You may shower the day after the procedure.Remove the bandage (dressing) and gently wash the site with plain soap and water.Gently pat the site dry.  Do not apply powder or lotion to the site.  Do not submerge the affected site in water for 3 to 5 days.  Inspect the site at least twice daily.  Do not flex or bend the affected arm for 24 hours.  No lifting over 5 pounds (2.3 kg) for 5 days after your procedure.  Do not drive home if you are discharged the same day of the procedure. Have someone else drive you.  You may drive 24 hours after the procedure unless otherwise instructed by your caregiver.  What to expect: Any bruising will  usually fade within 1 to 2 weeks.  Blood that collects in the tissue (hematoma) may be painful to the touch. It should usually decrease in size and tenderness within 1 to 2 weeks.  SEEK IMMEDIATE MEDICAL CARE IF: You have unusual pain at the radial site.  You have redness, warmth, swelling, or pain at the radial site.  You have drainage (other than a small amount of blood on the dressing).  You have chills.  You have a fever or persistent symptoms for more than 72 hours.  You have a fever and your symptoms suddenly get worse.  Your arm becomes pale, cool, tingly, or numb.  You have heavy bleeding from the site. Hold pressure on the site.   Increase activity slowly    Complete by:  As directed       Discharge Medications   Current Discharge Medication List    START taking these medications   Details  aspirin 81 MG chewable tablet Chew 1 tablet (81 mg total) by mouth daily. Qty: 30 tablet    clopidogrel (PLAVIX) 75 MG tablet Take 1 tablet (75 mg total) by mouth daily with breakfast. Qty: 90 tablet, Refills: 1    irbesartan (AVAPRO) 75 MG tablet Take 0.5 tablets (37.5 mg total) by mouth daily. Qty: 90 tablet, Refills: 1    nitroGLYCERIN (NITROSTAT) 0.4 MG SL tablet Place 1 tablet (0.4 mg total) under the tongue every 5 (five) minutes as needed. Qty: 25 tablet, Refills: 2      CONTINUE these medications which have CHANGED   Details  atorvastatin (LIPITOR) 80 MG tablet Take 1 tablet (80 mg total) by mouth daily at 6 PM. Qty: 90 tablet, Refills: 1      CONTINUE these medications which have NOT CHANGED   Details  fluticasone (FLONASE) 50 MCG/ACT nasal spray Place 1 spray into both nostrils daily.    Loratadine-Pseudoephedrine (CLARITIN-D 24 HOUR PO) Take 1 tablet by mouth daily as needed (for allergies).      STOP taking these medications     [employer] ALLERGY RELIEF D 180-240 MG per 24 hr tablet          Aspirin prescribed at discharge?  Yes High Intensity Statin  Prescribed? (Lipitor 40-80mg  or Crestor 20-40mg ): Yes Beta Blocker Prescribed? No: Bradycardic For EF <40%, was ACEI/ARB Prescribed? Yes ADP Receptor Inhibitor Prescribed? (i.e. Plavix etc.-Includes Medically Managed Patients): Yes For EF <40%, Aldosterone  Inhibitor Prescribed? No: EF ok Was EF assessed during THIS hospitalization? Yes Was Cardiac Rehab II ordered? (Included Medically managed Patients): Yes   Outstanding Labs/Studies   FLP/LFTs in 6 weeks. BMET at f/u appt.   Duration of Discharge Encounter   Greater than 30 minutes including physician time.  Signed, Laverda Page NP-C 09/24/2016, 11:47 AM

## 2016-09-23 NOTE — Progress Notes (Addendum)
Progress Note  Patient Name: Marcus Mills Date of Encounter: 09/23/2016  Primary Cardiologist: Katrinka Blazing  Subjective   Feeling well this morning. No chest pain or breathing issues.   Inpatient Medications    Scheduled Meds: . aspirin  81 mg Oral Daily  . atorvastatin  80 mg Oral q1800  . clopidogrel  75 mg Oral Q breakfast  . heparin  5,000 Units Subcutaneous Q8H  . sodium chloride flush  3 mL Intravenous Q12H   Continuous Infusions: . sodium chloride     PRN Meds: sodium chloride, acetaminophen, ondansetron (ZOFRAN) IV, oxyCODONE-acetaminophen, sodium chloride flush   Vital Signs    Vitals:   09/23/16 0500 09/23/16 0600 09/23/16 0800 09/23/16 0815  BP: (!) 105/54 113/69 123/71 123/71  Pulse: (!) 47 (!) 50 62 (!) 54  Resp: 15 12 (!) 25 18  Temp:    98.2 F (36.8 C)  TempSrc:    Oral  SpO2: 96% 98% 99% 97%  Weight:      Height:        Intake/Output Summary (Last 24 hours) at 09/23/16 0906 Last data filed at 09/23/16 0800  Gross per 24 hour  Intake            703.7 ml  Output             3020 ml  Net          -2316.3 ml   Filed Weights   09/22/16 0416  Weight: 204 lb (92.5 kg)    Telemetry    SB with brief run of NSVT last evening - Personally Reviewed  ECG    Sinus bradycardia 59 BPM. QT prolonged between 500-520. Evolutionary changes of inferior ST elevation MI with T-wave inversions and subtle reducing inferior ST elevations and II, III, aVF and V6- Personally Reviewed Unfortunately old EKG is not available to read.  Physical Exam   General: Well developed, well nourished, male appearing in no acute distress. Head: Normocephalic, atraumatic.  Neck: Supple without bruits, JVD. Lungs:  Resp regular and unlabored, CTA. Heart: Brady, S1, S2, no S3, S4, 2/6 SEM @ RUSB ; no rub. Abdomen: Soft, non-tender, non-distended with normoactive bowel sounds. No hepatomegaly. No rebound/guarding. No obvious abdominal masses. Extremities: No clubbing,  cyanosis, edema. Distal pedal pulses are 2+ bilaterally. Right radial cath site stable.  Neuro: Alert and oriented X 3. Moves all extremities spontaneously. Psych: Normal affect.  Labs    Chemistry Recent Labs Lab 09/22/16 0241 09/22/16 0245 09/22/16 0549  NA 139 138 139  K 4.1 4.0 4.3  CL 102 105 106  CO2  --  24 25  GLUCOSE 114* 107* 105*  BUN 19 17 16   CREATININE 0.70 0.76 0.71  CALCIUM  --  9.5 9.2  PROT  --  6.2*  --   ALBUMIN  --  3.7  --   AST  --  29  --   ALT  --  31  --   ALKPHOS  --  58  --   BILITOT  --  0.7  --   GFRNONAA  --  >60 >60  GFRAA  --  >60 >60  ANIONGAP  --  9 8     Hematology Recent Labs Lab 09/22/16 0245 09/22/16 0549 09/22/16 0917  WBC 8.3 7.3 8.3  RBC 4.35 4.13* 4.04*  HGB 12.9* 12.3* 12.0*  HCT 38.9* 37.4* 36.8*  MCV 89.4 90.6 91.1  MCH 29.7 29.8 29.7  MCHC 33.2 32.9 32.6  RDW 13.2 13.3 13.4  PLT 182 168 157    Cardiac Enzymes Recent Labs Lab 09/22/16 0245 09/22/16 0549 09/22/16 0917 09/22/16 1540  TROPONINI 0.17* 22.94* 30.27* 35.32*   No results for input(s): TROPIPOC in the last 168 hours.   BNPNo results for input(s): BNP, PROBNP in the last 168 hours.   DDimer No results for input(s): DDIMER in the last 168 hours.    Radiology    No results found.  Cardiac Studies   LHC: 09/22/16  Conclusion    Acute inferolateral ST elevation myocardial infarction with initial onset of symptoms 36 hours ago followed by waxing and waning chest discomfort.  Successful PTCA of the continuation of the right coronary beyond the PDA reducing 100% stenosis to less than 30% with TIMI grade 3 flow. Stent implantation was not performed because of unfavorable lesion characteristics/location.  50% proximal to mid LAD stenosis and 50% distal RCA stenosis before the PDA. Left main is widely patent.  Right dominant coronary anatomy.  Elevated LVEDP at 20 mmHg is consistent with acute diastolic heart  failure.  RECOMMENDATIONS:   Aggrastat 18 hours.  Aspirin and Plavix for 1 year.  High intensity statin therapy.  Beta blocker therapy as tolerated.  2-D Doppler echocardiogram to assess systolic function   Diagnostic Diagram7/9/18                                           Post-Intervention Diagram - PTCA of rPAV                             TTE: 09/22/16  Study Conclusions  - Left ventricle: The cavity size was normal. Wall thickness was   increased in a pattern of mild LVH. There was mild focal basal   hypertrophy of the septum. Systolic function was normal. The   estimated ejection fraction was in the range of 55% to 60%. There   is akinesis of the basal-midinferolateral myocardium. Features   are consistent with a pseudonormal left ventricular filling   pattern, with concomitant abnormal relaxation and increased   filling pressure (grade 2 diastolic dysfunction). - Aortic valve: Valve mobility was restricted. There was mild   stenosis. Valve area (VTI): 1.51 cm^2. Valve area (Vmax): 1.54   cm^2. Valve area (Vmean): 1.48 cm^2. - Aortic root: The aortic root was mildly dilated. - Mitral valve: Calcified annulus. There was mild regurgitation.  Impressions:  - Akinesis of the inferolateral wall with overall preserved LV   systolic function; mild LVH; moderate diastolic dysfunction;   calcified aortic valve with mild AS (mean gradient 12 mmHg);   mildly dilated aortic root; mild MR.  Patient Profile     71 y.o. male without known hx of CAD who [facility] with inferolateral STEMI. Underwent emergent cath with Dr. Katrinka BlazingSmith with occluded distal RCA/RPAV after the PDA --> PTCA only due to tortuous RCA and poor location for stent placement due to bifurcation with PDA.  Assessment & Plan    1. Inferolateral STEMI: Status post PTCA only of RPAV. Was continued on aggrastat for 18 hours post cath. Trop peaked at 35.32.  -- Plan for DAPT minimum of 3  months, but Plavix for 1 year -- continue on high dose statin -- blood pressures have been soft, but improved today. May be able to tolerate low  doses ACEi/ARB. Remains bradycardiac, therefore no BB added.  -- check post cath EKG -- plan to mobilized today, transfer to telemetry  2. HL: LDL 126 above goal. Continue on high dose statin   3. Acute diastolic HF: Noted to have elevated filling pressures on cath. Echo showed normal LV function, but G2 DD. Appears euvolemic on exam.   4. Bradycardia: Rates down into the 30s while at rest, but no pauses noted. No room for BB at this time.   Mild AS on Echo - explains murmur.  Will need to monitor as OP.  Signed, Laverda Page, NP  09/23/2016, 9:06 AM     I have seen, examined and evaluated the patient this AM along with Laverda Page, NP-C.  After reviewing all the available data and chart, we discussed the patients laboratory, study & physical findings as well as symptoms in detail. I agree with her findings, examination as well as impression recommendations as per our discussion.    Attending adjustments noted in italics.   Erskine Squibb is progressing well post MI. No recurrent anginal or heart failure symptoms. She is euvolemic Blood pressure stable, with elevated diastolic pressures START LOW-DOSE ARB TODAY (AVAPRO 37.5 MG) No beta blocker due to bradycardia  High-dose statin  Endo leak today. Transfer to telemetry   Bryan Lemma, M.D., M.S. Interventional Cardiologist   Pager # [phone] Phone # [phone] [address] [place], [place] [address]

## 2016-09-23 NOTE — Progress Notes (Signed)
CARDIAC REHAB PHASE I   PRE:  Rate/Rhythm: 67 SR  BP:  Sitting: 127/72        SaO2: 96 RA  MODE:  Ambulation: 740 ft   POST:  Rate/Rhythm: 76 SR  BP:  Sitting: 127/72         SaO2: 97 RA  Pt ambulated 740 ft on RA, handheld assist, steady gait, tolerated well with no complaints. Completed MI/PCI education with pt and wife at bedside.  Reviewed risk factors, anti-platelet therapy, MI book, activity restrictions, ntg, exercise, heart healthy diet, s/s heart failure, sodium restrictions, and phase 2 cardiac rehab. Pt and verbalized understanding, receptive to education. Pt agrees to phase 2 cardiac rehab referral, will send to [facility] per pt request. Pt states he does have insurance with [employer]. Pt very eager to know when he may return to work. Pt to recliner after walk, call bell within reach. Encouraged additional ambulation today as tolerated. Will follow.   [phone] Joylene GrapesEmily C Keilynn Marano, RN, BSN 09/23/2016 10:38 AM

## 2016-09-24 MED ORDER — NITROGLYCERIN 0.4 MG SL SUBL: 0.4000 mg | SUBLINGUAL_TABLET | SUBLINGUAL | 2 refills | Status: DC | PRN

## 2016-09-24 MED ORDER — ASPIRIN 81 MG PO CHEW: 81.0000 mg | CHEWABLE_TABLET | Freq: Every day | ORAL | Status: DC

## 2016-09-24 MED ORDER — IRBESARTAN 75 MG PO TABS: 37.5000 mg | ORAL_TABLET | Freq: Every day | ORAL | 1 refills | Status: DC

## 2016-09-24 MED ORDER — CLOPIDOGREL BISULFATE 75 MG PO TABS: 75.0000 mg | ORAL_TABLET | Freq: Every day | ORAL | 1 refills | Status: DC

## 2016-09-24 MED ORDER — ATORVASTATIN CALCIUM 80 MG PO TABS: 80.0000 mg | ORAL_TABLET | Freq: Every day | ORAL | 1 refills | Status: DC

## 2016-09-24 MED FILL — Nitroglycerin IV Soln 100 MCG/ML in D5W: INTRA_ARTERIAL | Qty: 10 | Status: AC

## 2016-09-24 NOTE — Progress Notes (Signed)
CARDIAC REHAB PHASE I Offered to walk pt, states he already ambulated independently, denies any complaints, declined additional ambulation at this time. Briefly reviewed education with pt and wife, answered their questions. Phase 2 referral sent to [place]. Pt in bed, [facility] reach, awaiting discharge.  [phone] Joylene GrapesEmily C Natia Fahmy, RN, BSN 09/24/2016 12:38 PM

## 2016-09-24 NOTE — Progress Notes (Signed)
Cardiology Office Note    Date:  09/30/2016   ID:  Marcus Mills, DOB 07-Nov-1945, MRN [id number]  PCP:  Stacie Glaze, MD  Cardiologist:  Dr. Katrinka Blazing   Chief Complaint: Hospital follow up for STEMI  History of Present Illness:   Marcus Mills is a 71 y.o. male presents for hospital follow up.   Admitted with STEMI 09/22/16.  Underwent LHC with Dr. Katrinka Blazing noted above with occluded distal RCA/RPAV after the PDA -->PTCA only due to tortuous RCA and poor location for stent placement due to bifurcation with PDA. He was continued on aggrastat for 18 hours post cath. Trop peaked at 35.32. Not on BB due to bradycardia. Echo with normal EF.   Patient is her for follow up. No chest pain or dyspnea with walking. He is taking Avapro 75 mg instead of half a tablet of 37.5. He feels weak in his leg after taking Avapro. Today he has similar filling with blood pressure of 104/70. He denies orthopnea, PND, syncope, lower extremity edema, palpitation, dizziness, melena or blood in his stool or urine.  Past Medical History:  Diagnosis Date  . ALLERGIC RHINITIS 11/04/2006  . CAD S/P percutaneous coronary angioplasty 09/21/2016   PTCA of dRCA/rPAV after rPDA 100% to 30%. Not favorable for Stent 50% p-mLAD & dRCA.  Marland Kitchen HYPERLIPIDEMIA 11/04/2006    Past Surgical History:  Procedure Laterality Date  . CORONARY BALLOON ANGIOPLASTY N/A 09/22/2016   Procedure: Coronary Balloon Angioplasty;  Surgeon: Lyn Records, MD;  Location: [facility] INVASIVE CV LAB;  Service: Cardiovascular;  Laterality: N/A;  PLA  . LEFT HEART CATH AND CORONARY ANGIOGRAPHY N/A 09/22/2016   Procedure: Left Heart Cath and Coronary Angiography;  Surgeon: Lyn Records, MD;  Location: [facility] INVASIVE CV LAB;  Service: Cardiovascular;  Laterality: N/A;  . TONSILLECTOMY      Current Medications:  Prior to Admission medications   Medication Sig Start Date End Date Taking? Authorizing Provider  aspirin 81 MG chewable tablet Chew 1 tablet (81 mg  total) by mouth daily. 09/25/16  Yes Arty Baumgartner, NP  atorvastatin (LIPITOR) 80 MG tablet Take 1 tablet (80 mg total) by mouth daily at 6 PM. 09/24/16  Yes Arty Baumgartner, NP  clopidogrel (PLAVIX) 75 MG tablet Take 1 tablet (75 mg total) by mouth daily with breakfast. 09/25/16  Yes Arty Baumgartner, NP  irbesartan (AVAPRO) 75 MG tablet Take 75 mg by mouth daily.   Yes [provider]  nitroGLYCERIN (NITROSTAT) 0.4 MG SL tablet Place 1 tablet (0.4 mg total) under the tongue every 5 (five) minutes as needed. 09/24/16  Yes Arty Baumgartner, NP  nitroGLYCERIN (NITROSTAT) 0.4 MG SL tablet Place 0.4 mg under the tongue every 5 (five) minutes as needed for chest pain.   Yes [provider]    Allergies:   Patient has no known allergies.   Social History   Social History  . Marital status: Married    Spouse name: N/A  . Number of children: N/A  . Years of education: N/A   Occupational History  . salesman    Social History Main Topics  . Smoking status: Former Smoker    Quit date: 04/26/1987  . Smokeless tobacco: Never Used  . Alcohol use 0.5 oz/week    1 Standard drinks or equivalent per week  . Drug use: No  . Sexual activity: Yes   Other Topics Concern  . None   Social History Narrative  .  None     Family History:  The patient's family history includes Breast cancer in his sister; Cancer in his father and mother; Hypertension in his mother.   ROS:   Please see the history of present illness.    ROS All other systems reviewed and are negative.   PHYSICAL EXAM:   VS:  BP 104/70   Pulse 76   Ht 5\' 10"  (1.778 m)   Wt 195 lb 1.9 oz (88.5 kg)   BMI 28.00 kg/m    GEN: Well nourished, well developed, in no acute distress  HEENT: normal  Neck: no JVD, carotid bruits, or masses Cardiac: RRR; no murmurs, rubs, or gallops,no edema. Right radial cath site looks good without ecchymosis or bruit. Respiratory:  clear to auscultation bilaterally, normal work  of breathing GI: soft, nontender, nondistended, + BS MS: no deformity or atrophy  Skin: warm and dry, no rash Neuro:  Alert and Oriented x 3, Strength and sensation are intact Psych: euthymic mood, full affect  Wt Readings from Last 3 Encounters:  09/30/16 195 lb 1.9 oz (88.5 kg)  09/24/16 198 lb 11.2 oz (90.1 kg)  01/03/12 177 lb (80.3 kg)      Studies/Labs Reviewed:   EKG:  EKG is not  ordered today.    Recent Labs: 09/22/2016: ALT 31; BUN 16; Creatinine, Ser 0.71; Hemoglobin 12.0; Platelets 157; Potassium 4.3; Sodium 139   Lipid Panel    Component Value Date/Time   CHOL 184 09/22/2016 0245   TRIG 62 09/22/2016 0245   HDL 46 09/22/2016 0245   CHOLHDL 4.0 09/22/2016 0245   VLDL 12 09/22/2016 0245   LDLCALC 126 (H) 09/22/2016 0245   LDLDIRECT 204.6 04/25/2010 1321    Additional studies/ records that were reviewed today include:   :LHC: 09/22/16  Conclusion    Acute inferolateral ST elevation myocardial infarction with initial onset of symptoms 36 hours ago followed by waxing and waning chest discomfort.  Successful PTCA of the continuation of the right coronary beyond the PDA reducing 100% stenosis to less than 30% with TIMI grade 3 flow. Stent implantation was not performed because of unfavorable lesion characteristics/location.  50% proximal to mid LAD stenosis and 50% distal RCA stenosis before the PDA. Left main is widely patent.  Right dominant coronary anatomy.  Elevated LVEDP at 20 mmHg is consistent with acute diastolic heart failure.  RECOMMENDATIONS:   Aggrastat 18 hours.  Aspirin and Plavix for 1 year.  High intensity statin therapy.  Beta blocker therapy as tolerated.  2-D Doppler echocardiogram to assess systolic function   TTE: 09/22/16  Study Conclusions  - Left ventricle: The cavity size was normal. Wall thickness was increased in a pattern of mild LVH. There was mild focal basal hypertrophy of the septum. Systolic function  was normal. The estimated ejection fraction was in the range of 55% to 60%. There is akinesis of the basal-midinferolateral myocardium. Features are consistent with a pseudonormal left ventricular filling pattern, with concomitant abnormal relaxation and increased filling pressure (grade 2 diastolic dysfunction). - Aortic valve: Valve mobility was restricted. There was mild stenosis. Valve area (VTI): 1.51 cm^2. Valve area (Vmax): 1.54 cm^2. Valve area (Vmean): 1.48 cm^2. - Aortic root: The aortic root was mildly dilated. - Mitral valve: Calcified annulus. There was mild regurgitation.  Impressions:  - Akinesis of the inferolateral wall with overall preserved LV systolic function; mild LVH; moderate diastolic dysfunction; calcified aortic valve with mild AS (mean gradient 12 mmHg); mildly dilated aortic root; mild  MR     ASSESSMENT & PLAN:    1. CAD s/p PTCA of RCA - No angina or dyspnea. Continue exercise regimen.  CRP II.  - Continue aspirin, Plavix, Lipitor and Avapro. - He does not have history of high blood pressure. Normal Ef with grade 2 DD. No sign of HF. Given low blood pressures and will decrease Avapro to 37.5 mg daily.  2. HLD - 09/22/2016: Cholesterol 184; HDL 46; LDL Cholesterol 126; Triglycerides 62; VLDL 12  - Continue statin. Lipid panel and LFT in 6 weeks.  F/u with Dr. Katrinka BlazingSmith in 3 months.    Medication Adjustments/Labs and Tests Ordered: Current medicines are reviewed at length with the patient today.  Concerns regarding medicines are outlined above.  Medication changes, Labs and Tests ordered today are listed in the Patient Instructions below. There are no Patient Instructions on file for this visit.   Lorelei PontSigned, Mekenna Finau, [place]  09/30/2016 8:35 AM    [facility] [address], [place], [place]  [address] Phone: [phone]; Fax: [phone]

## 2016-09-25 ENCOUNTER — Telehealth: Payer: Self-pay

## 2016-09-25 NOTE — Telephone Encounter (Signed)
Patient contacted [facility] on 09/24/2016.  Patient understands to follow up with provider Chelsea AusVin Bhagat on 09/30/2016 [facility]. Patient understands discharge instructions? yes Patient understands medications and regiment? yes Patient understands to bring all medications to this visit? yes  Pt states he is doing great.  Has been trying to follow cardiac rehabilitation instructions regarding exercise, but states he feels like he could do more.  Encouraged Pt to follow their guidelines.  Pt states he has all his medications and is tolerating them well.  Denies any chest pain or sob.  States his cath site looks good, no pain or swelling.  Pt denies any educational needs.

## 2016-09-25 NOTE — Telephone Encounter (Signed)
Outreach made to Pt for TCM follow up.  Call went to VM.  Left generic message requesting call back to this nurse.  Await return call.

## 2016-09-25 NOTE — Telephone Encounter (Signed)
-----   Message from Arty BaumgartnerLindsay B Roberts, NP sent at 09/23/2016  4:03 PM EDT ----- Regarding: TOC f/u Needs a TOC f/u call.   Thx Lillia AbedLindsay

## 2016-09-30 ENCOUNTER — Ambulatory Visit (INDEPENDENT_AMBULATORY_CARE_PROVIDER_SITE_OTHER): Payer: BLUE CROSS/BLUE SHIELD | Admitting: Physician Assistant

## 2016-09-30 ENCOUNTER — Encounter: Payer: Self-pay | Admitting: Physician Assistant

## 2016-09-30 VITALS — BP 104/70 | HR 76 | Ht 70.0 in | Wt 195.1 lb

## 2016-09-30 DIAGNOSIS — I2111 ST elevation (STEMI) myocardial infarction involving right coronary artery: Secondary | ICD-10-CM | POA: Diagnosis not present

## 2016-09-30 DIAGNOSIS — I25110 Atherosclerotic heart disease of native coronary artery with unstable angina pectoris: Secondary | ICD-10-CM | POA: Diagnosis not present

## 2016-09-30 DIAGNOSIS — E785 Hyperlipidemia, unspecified: Secondary | ICD-10-CM | POA: Diagnosis not present

## 2016-09-30 MED ORDER — IRBESARTAN 75 MG PO TABS: 37.5000 mg | ORAL_TABLET | Freq: Every day | ORAL | 3 refills | Status: DC

## 2016-09-30 NOTE — Patient Instructions (Addendum)
Medication Instructions:   START TAKING AVAPRO  37.5 9 HALF OF 75 MG ) ONCE A DAY   If you need a refill on your cardiac medications before your next appointment, please call your pharmacy.  Labwork: RETURN IN 6 WEEKS FOR FASTING LIPIDS AND LIVER   Testing/Procedures: NONE ORDERED  TODAY   Follow-Up: WITH DR Katrinka BlazingSMITH IN 3 TO 4 MONTHS    Any Other Special Instructions Will Be Listed Below (If Applicable).

## 2016-09-30 NOTE — Addendum Note (Signed)
Addended by: Oleta MouseVERTON, Arvin Abello M on: 09/30/2016 08:45 AM   Modules accepted: Orders

## 2016-10-02 DIAGNOSIS — L820 Inflamed seborrheic keratosis: Secondary | ICD-10-CM | POA: Diagnosis not present

## 2016-10-02 DIAGNOSIS — L570 Actinic keratosis: Secondary | ICD-10-CM | POA: Diagnosis not present

## 2016-10-06 ENCOUNTER — Telehealth: Payer: Self-pay | Admitting: Cardiology

## 2016-10-06 NOTE — Telephone Encounter (Signed)
Marcus Mills is calling because he has some questions about how he was feeling on yesterday  , he had some aching and no energy. Also a question about dental . Please call   Thanks

## 2016-10-06 NOTE — Telephone Encounter (Signed)
The patient called in stating that his Atorvastatin was increased to 80 mg while he was in the hospital. He stated that yesterday he experienced muscle pain but felt better today. He was wondering if it was the increase in the Atorvastatin. He has been instructed to call back if he experiences these effects again. He verbalized his understanding.

## 2016-10-07 ENCOUNTER — Other Ambulatory Visit: Payer: Self-pay

## 2016-10-07 ENCOUNTER — Telehealth: Payer: Self-pay | Admitting: Cardiology

## 2016-10-07 MED ORDER — NITROGLYCERIN 0.4 MG SL SUBL: 0.4000 mg | SUBLINGUAL_TABLET | SUBLINGUAL | 2 refills | Status: DC | PRN

## 2016-10-07 NOTE — Telephone Encounter (Signed)
Mr.Wagner is calling because his brand new bottle ( Nitrogylercin) was washed up in the washing machine and he is needing to know what can he do , or can a new prescription be sent to the pharmacy . Please call

## 2016-10-07 NOTE — Telephone Encounter (Signed)
rx sent to pharmacy and pt notified. 

## 2016-10-17 ENCOUNTER — Telehealth: Payer: Self-pay | Admitting: Internal Medicine

## 2016-10-17 NOTE — Telephone Encounter (Signed)
Wife called in earlier  Pts BP 97/70 P 88 Had diarrhea all day  T 100.1    No blood in stool   NO vomiting  No CP  No SOB  Urinating  No significant abdominal pains  Recomm:  Drink fluids Hold irbasartan until diarrhea resolved  Can hold lipitro also Keep taking ASA and Plavix   Call back if worsens or go to ED.

## 2016-10-19 ENCOUNTER — Observation Stay (HOSPITAL_COMMUNITY): Payer: BLUE CROSS/BLUE SHIELD

## 2016-10-19 ENCOUNTER — Telehealth: Payer: Self-pay | Admitting: Internal Medicine

## 2016-10-19 ENCOUNTER — Inpatient Hospital Stay (HOSPITAL_COMMUNITY)
Admission: EM | Admit: 2016-10-19 | Discharge: 2016-10-21 | DRG: 371 | Disposition: A | Discharge status: Home / Self Care | Payer: BLUE CROSS/BLUE SHIELD | Attending: Internal Medicine | Admitting: Internal Medicine

## 2016-10-19 ENCOUNTER — Encounter (HOSPITAL_COMMUNITY): Payer: Self-pay | Admitting: *Deleted

## 2016-10-19 DIAGNOSIS — E876 Hypokalemia: Secondary | ICD-10-CM | POA: Diagnosis not present

## 2016-10-19 DIAGNOSIS — I110 Hypertensive heart disease with heart failure: Secondary | ICD-10-CM | POA: Diagnosis present

## 2016-10-19 DIAGNOSIS — Z8249 Family history of ischemic heart disease and other diseases of the circulatory system: Secondary | ICD-10-CM | POA: Diagnosis not present

## 2016-10-19 DIAGNOSIS — E860 Dehydration: Secondary | ICD-10-CM

## 2016-10-19 DIAGNOSIS — I2119 ST elevation (STEMI) myocardial infarction involving other coronary artery of inferior wall: Secondary | ICD-10-CM | POA: Diagnosis not present

## 2016-10-19 DIAGNOSIS — E871 Hypo-osmolality and hyponatremia: Secondary | ICD-10-CM | POA: Diagnosis not present

## 2016-10-19 DIAGNOSIS — R197 Diarrhea, unspecified: Secondary | ICD-10-CM

## 2016-10-19 DIAGNOSIS — A09 Infectious gastroenteritis and colitis, unspecified: Secondary | ICD-10-CM | POA: Diagnosis not present

## 2016-10-19 DIAGNOSIS — I2510 Atherosclerotic heart disease of native coronary artery without angina pectoris: Secondary | ICD-10-CM | POA: Diagnosis present

## 2016-10-19 DIAGNOSIS — Z7982 Long term (current) use of aspirin: Secondary | ICD-10-CM | POA: Diagnosis not present

## 2016-10-19 DIAGNOSIS — I252 Old myocardial infarction: Secondary | ICD-10-CM

## 2016-10-19 DIAGNOSIS — Z7902 Long term (current) use of antithrombotics/antiplatelets: Secondary | ICD-10-CM | POA: Diagnosis not present

## 2016-10-19 DIAGNOSIS — R55 Syncope and collapse: Secondary | ICD-10-CM

## 2016-10-19 DIAGNOSIS — I5032 Chronic diastolic (congestive) heart failure: Secondary | ICD-10-CM | POA: Diagnosis present

## 2016-10-19 DIAGNOSIS — A020 Salmonella enteritis: Secondary | ICD-10-CM | POA: Diagnosis not present

## 2016-10-19 DIAGNOSIS — E785 Hyperlipidemia, unspecified: Secondary | ICD-10-CM | POA: Diagnosis present

## 2016-10-19 DIAGNOSIS — Z87891 Personal history of nicotine dependence: Secondary | ICD-10-CM | POA: Diagnosis not present

## 2016-10-19 DIAGNOSIS — Z9861 Coronary angioplasty status: Secondary | ICD-10-CM

## 2016-10-19 DIAGNOSIS — Z79899 Other long term (current) drug therapy: Secondary | ICD-10-CM | POA: Diagnosis not present

## 2016-10-19 DIAGNOSIS — K529 Noninfective gastroenteritis and colitis, unspecified: Secondary | ICD-10-CM | POA: Diagnosis not present

## 2016-10-19 LAB — COMPREHENSIVE METABOLIC PANEL
ALBUMIN: 3.9 g/dL (ref 3.5–5.0)
ALK PHOS: 73 U/L (ref 38–126)
ALT: 39 U/L (ref 17–63)
ANION GAP: 10 (ref 5–15)
AST: 34 U/L (ref 15–41)
BUN: 13 mg/dL (ref 6–20)
CALCIUM: 9.1 mg/dL (ref 8.9–10.3)
CO2: 21 mmol/L — AB (ref 22–32)
Chloride: 98 mmol/L — ABNORMAL LOW (ref 101–111)
Creatinine, Ser: 0.97 mg/dL (ref 0.61–1.24)
GFR calc Af Amer: >60 mL/min (ref >60)
GFR calc non Af Amer: >60 mL/min (ref >60)
GLUCOSE: 114 mg/dL — AB (ref 65–99)
Potassium: 3.4 mmol/L — ABNORMAL LOW (ref 3.5–5.1)
SODIUM: 129 mmol/L — AB (ref 135–145)
Total Bilirubin: 0.8 mg/dL (ref 0.3–1.2)
Total Protein: 6.9 g/dL (ref 6.5–8.1)

## 2016-10-19 LAB — URINALYSIS, ROUTINE W REFLEX MICROSCOPIC
BILIRUBIN URINE: NEGATIVE
Glucose, UA: NEGATIVE mg/dL
HGB URINE DIPSTICK: NEGATIVE
Ketones, ur: NEGATIVE mg/dL
Leukocytes, UA: NEGATIVE
Nitrite: NEGATIVE
PH: 6.5 (ref 5.0–8.0)
Protein, ur: NEGATIVE mg/dL

## 2016-10-19 LAB — CBC
HEMATOCRIT: 42.7 % (ref 39.0–52.0)
HEMOGLOBIN: 14.4 g/dL (ref 13.0–17.0)
MCH: 29.6 pg (ref 26.0–34.0)
MCHC: 33.7 g/dL (ref 30.0–36.0)
MCV: 87.7 fL (ref 78.0–100.0)
Platelets: 164 10*3/uL (ref 150–400)
RBC: 4.87 MIL/uL (ref 4.22–5.81)
RDW: 13.5 % (ref 11.5–15.5)
WBC: 4 10*3/uL (ref 4.0–10.5)

## 2016-10-19 LAB — PHOSPHORUS: Phosphorus: 2.4 mg/dL — ABNORMAL LOW (ref 2.5–4.6)

## 2016-10-19 LAB — LIPASE, BLOOD: Lipase: 21 U/L (ref 11–51)

## 2016-10-19 LAB — C DIFFICILE QUICK SCREEN W PCR REFLEX
C Diff antigen: NEGATIVE
C Diff interpretation: NOT DETECTED
C Diff toxin: NEGATIVE

## 2016-10-19 LAB — MAGNESIUM: Magnesium: 1.8 mg/dL (ref 1.7–2.4)

## 2016-10-19 MED ORDER — ONDANSETRON 4 MG PO TBDP
4.0000 mg | ORAL_TABLET | Freq: Once | ORAL | Status: AC | PRN
  Administered 2016-10-19: 4 mg via ORAL

## 2016-10-19 MED ORDER — POTASSIUM CHLORIDE CRYS ER 20 MEQ PO TBCR
40.0000 meq | EXTENDED_RELEASE_TABLET | Freq: Once | ORAL | Status: AC
  Administered 2016-10-19: 40 meq via ORAL
  Filled 2016-10-19: qty 2

## 2016-10-19 MED ORDER — SODIUM PHOSPHATES 45 MMOLE/15ML IV SOLN
10.0000 mmol | Freq: Once | INTRAVENOUS | Status: AC
  Administered 2016-10-19: 10 mmol via INTRAVENOUS
  Filled 2016-10-19: qty 3.33

## 2016-10-19 MED ORDER — ATORVASTATIN CALCIUM 80 MG PO TABS
80.0000 mg | ORAL_TABLET | Freq: Every day | ORAL | Status: DC
  Administered 2016-10-19 – 2016-10-20 (×2): 80 mg via ORAL
  Filled 2016-10-19 (×2): qty 1

## 2016-10-19 MED ORDER — ONDANSETRON 4 MG PO TBDP
ORAL_TABLET | ORAL | Status: AC
  Filled 2016-10-19: qty 1

## 2016-10-19 MED ORDER — ASPIRIN 81 MG PO CHEW
81.0000 mg | CHEWABLE_TABLET | Freq: Every day | ORAL | Status: DC
  Administered 2016-10-19 – 2016-10-21 (×3): 81 mg via ORAL
  Filled 2016-10-19 (×4): qty 1

## 2016-10-19 MED ORDER — CLOPIDOGREL BISULFATE 75 MG PO TABS
75.0000 mg | ORAL_TABLET | Freq: Every day | ORAL | Status: DC
  Administered 2016-10-20 – 2016-10-21 (×2): 75 mg via ORAL
  Filled 2016-10-19 (×2): qty 1

## 2016-10-19 MED ORDER — ACETAMINOPHEN 650 MG RE SUPP: 650.0000 mg | Freq: Four times a day (QID) | RECTAL | Status: DC | PRN

## 2016-10-19 MED ORDER — ONDANSETRON HCL 4 MG/2ML IJ SOLN: 4.0000 mg | Freq: Four times a day (QID) | INTRAMUSCULAR | Status: DC | PRN

## 2016-10-19 MED ORDER — ONDANSETRON HCL 4 MG PO TABS: 4.0000 mg | ORAL_TABLET | Freq: Four times a day (QID) | ORAL | Status: DC | PRN

## 2016-10-19 MED ORDER — SODIUM CHLORIDE 0.9 % IV SOLN
INTRAVENOUS | Status: AC
  Administered 2016-10-19: 18:00:00 via INTRAVENOUS

## 2016-10-19 MED ORDER — SODIUM CHLORIDE 0.9 % IV BOLUS (SEPSIS)
1000.0000 mL | Freq: Once | INTRAVENOUS | Status: AC
  Administered 2016-10-19: 1000 mL via INTRAVENOUS

## 2016-10-19 MED ORDER — ACETAMINOPHEN 325 MG PO TABS: 650.0000 mg | ORAL_TABLET | Freq: Four times a day (QID) | ORAL | Status: DC | PRN

## 2016-10-19 NOTE — H&P (Signed)
History and Physical    Marcus Mills UYQ:[id number] DOB: 11/13/1945 DOA: 10/19/2016  PCP: Velna Hatchet, MD Patient coming from: home  Chief Complaint: Persistent nausea and diarrhea/syncope  HPI: Marcus Mills is a 71 y.o. male with medical history significant for recent STEMI, hypertension presents to the emergency department with the chief complaint persistent diarrhea generalized weakness and syncope.  Information is obtained from the patient and his wife is at the bedside. He reports a three-day history of watery diarrhea associated with intermittent abdominal cramping and persistent nausea. He denies any vomiting. He reports he has had approximately 15 watery stools per day for the last 3 days. He has been drinking a lot of fluids but not much food. He remains nauseous with no vomiting. He states his stool is practically clear and all water. He denies any [name] stool he denies any blood in his stool. He was taking Pepto-Bismol. He denies any recent antibiotics but was in the hospital 4 weeks ago for STEMI. In addition last week he visited a friend who was in the hospital [place]. 3 days ago he called his cardiologist when symptoms started NA recommended holding blood pressure medicine and a statin. He describes the pain in his abdomen as cramp-like intermittent sharp diffusely located. He denies any abdominal distention. He denies dysuria hematuria frequency or urgency. He denies any fever chills headache. He states last night after he had gone to the bathroom he felt very hot and as he was walking towards the same in he "collapsed". He denies hitting his head. He awakened very shortly after.    ED Course: In emergency department his temperature is 90.9. His blood pressure is on the soft side. He is not hypoxic  Review of Systems: As per HPI otherwise all other systems reviewed and are negative.   Ambulatory Status: Ambulates independently. Independent with ADLs  Past Medical  History:  Diagnosis Date  . ALLERGIC RHINITIS 11/04/2006  . CAD S/P percutaneous coronary angioplasty 09/21/2016   PTCA of dRCA/rPAV after rPDA 100% to 30%. Not favorable for Stent 50% p-mLAD & dRCA.  Marland Kitchen Chronic diastolic CHF (congestive heart failure) ([place])   . HYPERLIPIDEMIA 11/04/2006    Past Surgical History:  Procedure Laterality Date  . CORONARY BALLOON ANGIOPLASTY N/A 09/22/2016   Procedure: Coronary Balloon Angioplasty;  Surgeon: Belva Crome, MD;  Location: [place] CV LAB;  Service: Cardiovascular;  Laterality: N/A;  PLA  . LEFT HEART CATH AND CORONARY ANGIOGRAPHY N/A 09/22/2016   Procedure: Left Heart Cath and Coronary Angiography;  Surgeon: Belva Crome, MD;  Location: [place] CV LAB;  Service: Cardiovascular;  Laterality: N/A;  . TONSILLECTOMY      Social History   Social History  . Marital status: Married    Spouse name: N/A  . Number of children: N/A  . Years of education: N/A   Occupational History  . salesman    Social History Main Topics  . Smoking status: Former Smoker    Quit date: 04/26/1987  . Smokeless tobacco: Never Used  . Alcohol use 0.5 oz/week    1 Standard drinks or equivalent per week  . Drug use: No  . Sexual activity: Yes   Other Topics Concern  . Not on file   Social History Narrative  . No narrative on file    No Known Allergies  Family History  Problem Relation Age of Onset  . Hypertension Mother   . Cancer Mother   . Cancer  Father   . Breast cancer Sister     Prior to Admission medications   Medication Sig Start Date End Date Taking? Authorizing Provider  acetaminophen (TYLENOL) 500 MG tablet Take 1,000 mg by mouth every 4 (four) hours as needed for fever.   Yes [provider]  aspirin 81 MG chewable tablet Chew 1 tablet (81 mg total) by mouth daily. Patient taking differently: Chew 81 mg by mouth every evening.  09/25/16  Yes Cheryln Manly, NP  clopidogrel (PLAVIX) 75 MG tablet Take 1 tablet (75 mg total)  by mouth daily with breakfast. 09/25/16  Yes Reino Bellis B, NP  Loperamide HCl (IMODIUM A-D) 1 MG/7.5ML LIQD Take 2 mg by mouth every 4 (four) hours as needed (diarrhea).   Yes [provider]  nitroGLYCERIN (NITROSTAT) 0.4 MG SL tablet Place 1 tablet (0.4 mg total) under the tongue every 5 (five) minutes as needed. Patient taking differently: Place 0.4 mg under the tongue every 5 (five) minutes as needed for chest pain.  10/07/16  Yes Leonie Man, MD  atorvastatin (LIPITOR) 80 MG tablet Take 1 tablet (80 mg total) by mouth daily at 6 PM. 09/24/16   Cheryln Manly, NP  irbesartan (AVAPRO) 75 MG tablet Take 0.5 tablets (37.5 mg total) by mouth daily. 09/30/16   Leanor Kail, PA    Physical Exam: Vitals:   10/19/16 1320 10/19/16 1500 10/19/16 1530  BP: 113/81 123/84 121/81  Pulse: 98  82  Resp: _0 Temp: 99 F (37.2 C)    TempSrc: Oral    SpO2: 98%  100%     General:  Appears calm and comfortable Sitting up in bed Eyes:  PERRL, EOMI, normal lids, iris ENT:  grossly normal hearing, lips & tongue, mucous membranes of his mouth are pink somewhat dry Neck:  no LAD, masses or thyromegaly Cardiovascular:  RRR, +murmur. No LE edema.  Respiratory:  CTA bilaterally, no w/r/r. Normal respiratory effort. Abdomen:  soft, ntnd, positive bowel sounds throughout mild diffuse tenderness to palpation Skin:  no rash or induration seen on limited exam Musculoskeletal:  grossly normal tone BUE/BLE, good ROM, no bony abnormality Psychiatric:  grossly normal mood and affect, speech fluent and appropriate, AOx3 Neurologic:  CN 2-12 grossly intact, moves all extremities in coordinated fashion, sensation intact  Labs on Admission: I have personally reviewed following labs and imaging studies  CBC:  Recent Labs Lab 10/19/16 1322  WBC 4.0  HGB 14.4  HCT 42.7  MCV 87.7  PLT 332   Basic Metabolic Panel:  Recent Labs Lab 10/19/16 1322  NA 129*  K 3.4*  CL 98*    CO2 21*  GLUCOSE 114*  BUN 13  CREATININE 0.97  CALCIUM 9.1   GFR: CrCl cannot be calculated (Unknown ideal weight.). Liver Function Tests:  Recent Labs Lab 10/19/16 1322  AST 34  ALT 39  ALKPHOS 73  BILITOT 0.8  PROT 6.9  ALBUMIN 3.9    Recent Labs Lab 10/19/16 1322  LIPASE 21   No results for input(s): AMMONIA in the last 168 hours. Coagulation Profile: No results for input(s): INR, PROTIME in the last 168 hours. Cardiac Enzymes: No results for input(s): CKTOTAL, CKMB, CKMBINDEX, TROPONINI in the last 168 hours. BNP (last 3 results) No results for input(s): PROBNP in the last 8760 hours. HbA1C: No results for input(s): HGBA1C in the last 72 hours. CBG: No results for input(s): GLUCAP in the last 168 hours. Lipid Profile: No results for  input(s): CHOL, HDL, LDLCALC, TRIG, CHOLHDL, LDLDIRECT in the last 72 hours. Thyroid Function Tests: No results for input(s): TSH, T4TOTAL, FREET4, T3FREE, THYROIDAB in the last 72 hours. Anemia Panel: No results for input(s): VITAMINB12, FOLATE, FERRITIN, TIBC, IRON, RETICCTPCT in the last 72 hours. Urine analysis:    Component Value Date/Time   BILIRUBINUR n 04/25/2010   PROTEINUR n 04/25/2010   UROBILINOGEN 0.2 04/25/2010   NITRITE n 04/25/2010   LEUKOCYTESUR n 04/25/2010    Creatinine Clearance: CrCl cannot be calculated (Unknown ideal weight.).  Sepsis Labs: _0 (procalcitonin:4,lacticidven:4) )No results found for this or any previous visit (from the past 240 hour(s)).   Radiological Exams on Admission: No results found.  EKG: Independently reviewed. Sinus rhythm Ventricular trigeminy Abnormal R-wave progression, early transition Probable inferior infarct, age indeterminate Lateral leads are also involved  Assessment/Plan Principal Problem:   Syncope Active Problems:   STEMI involving oth coronary artery of inferior wall (HCC)   CAD S/P percutaneous coronary angioplasty   Diarrhea   Dehydration    Hyponatremia   1. Syncope. Likely secondary decreased volume and dehydration in the setting of persistent watery diarrhea and decreased oral intake and anti-hypertensive medication. No leukocytosis. No fever. No recent antibiotics. In the hospital 4 weeks ago. EKG without acute changes. Initial troponin negative. Recent echo reveals an EF of 55% with mild LVH and grade 2 diastolic dysfunction. He received 1 L of normal saline in the emergency department -Admit to telemetry -Continue IV fluids -obtain orthostatic vital signs -Hold antihypertensive medications for now  #2. Persistent diarrhea/nausea. No episodes of vomiting. Etiology uncertain. No leukocytosis no fever no tachycardia blood pressure slightly soft. No recent antibiotics. Was in the hospital 4 weeks ago with a STEMI.  -GI pathogen panel -C. difficile PCR -2 view abdominal film -Clear liquid diet -Monitor closely -Hold off on any antibiotics for now  #3. Hyponatremia. 129. Likely related to #2. -IV fluids as noted above -Monitor urine output -Recheck in the morning  #4. Dehydration. Related to #2. -See above -Be met in the morning  #5. CAD/recent STEMI. Status post percutaneous coronary angioplasty She denies any chest pain. Initial troponin negative. EKG as noted above. -Continue home statin -Continue Plavix -Continue aspirin    DVT prophylaxis: plavix  Code Status: full  Family Communication: wife at bedside  Disposition Plan: home  Consults called: none  Admission status: obs    Dyanne Carrel M MD [facility]  If 7PM-7AM, please contact night-coverage [email or web address] Password [email or web address]  10/19/2016, 4:32 PM

## 2016-10-19 NOTE — ED Provider Notes (Signed)
[facility]-EMERGENCY DEPT Provider Note   CSN: [id number] Arrival date & time: 10/19/16  1315     History   Chief Complaint Chief Complaint  Patient presents with  . Diarrhea    HPI Marcus Mills is a 71 y.o. male.  HPI  Presents with generalized weakness and watery diarrhea. Since Friday has been 15 times a day, 3-4 times per night.  Low appetite. Drinking a lot of diluted gatorade.  Nausea, no vomiting.  Taking pepto bismol.  No black or bloody stools per patient. Wife thinks he may have been one time, after taking pepto. No recent antibiotics but was in hospital for STEMI four weeks ago.  Taking tylenol for fever, 99-101 for 3 days.  No new medications, did not stop any medications until after having diarrhea, then he was temporarily taken off of blood pressure medicine and statin.  Sharp pains, cramping pain, generalized. Now also having body aches, leg aches.  Son visiting from [facility], had diarrhea on Friday, he got over it quickly, had visited relative in hospital.  Reports episodes of lightheadedness and had one syncopal episode on Friday. Episode occurred after having diarrhea, walking into kitchen, feeling warm, lightheaded then had syncope. Denies injuries from fall.  Has had episodes of lightheadedness but will sit under fan and they improve. Feels generally weak. No chest pain or dyspnea.   Past Medical History:  Diagnosis Date  . ALLERGIC RHINITIS 11/04/2006  . CAD S/P percutaneous coronary angioplasty 09/21/2016   PTCA of dRCA/rPAV after rPDA 100% to 30%. Not favorable for Stent 50% p-mLAD & dRCA.  Marland Kitchen Chronic diastolic CHF (congestive heart failure) (HCC)   . HYPERLIPIDEMIA 11/04/2006    Patient Active Problem List   Diagnosis Date Noted  . Syncope 10/19/2016  . Diarrhea 10/19/2016  . Dehydration 10/19/2016  . Hyponatremia 10/19/2016  . STEMI involving oth coronary artery of inferior wall (HCC) 09/22/2016  . Bradycardia, sinus 09/22/2016  . Acute diastolic heart failure (HCC)  40/98/1191  . Systolic ejection murmur 09/22/2016  . ST elevation myocardial infarction (STEMI) of inferolateral wall, initial episode of care (HCC) 09/21/2016  . CAD S/P percutaneous coronary angioplasty 09/21/2016  . Shingles 01/03/2012  . L1 vertebral fracture (HCC) 11/19/2011  . Back pain, acute 11/18/2011  . Plantar fasciitis 04/25/2010  . Visit for preventive health examination 04/25/2010  . Hyperlipidemia with target low density lipoprotein (LDL) cholesterol less than 70 mg/dL 47/82/9562  . ALLERGIC RHINITIS 11/04/2006    Past Surgical History:  Procedure Laterality Date  . CORONARY BALLOON ANGIOPLASTY N/A 09/22/2016   Procedure: Coronary Balloon Angioplasty;  Surgeon: Lyn Records, MD;  Location: [facility] INVASIVE CV LAB;  Service: Cardiovascular;  Laterality: N/A;  PLA  . LEFT HEART CATH AND CORONARY ANGIOGRAPHY N/A 09/22/2016   Procedure: Left Heart Cath and Coronary Angiography;  Surgeon: Lyn Records, MD;  Location: [facility] INVASIVE CV LAB;  Service: Cardiovascular;  Laterality: N/A;  . TONSILLECTOMY         Home Medications    Prior to Admission medications   Medication Sig Start Date End Date Taking? Authorizing Provider  acetaminophen (TYLENOL) 500 MG tablet Take 1,000 mg by mouth every 4 (four) hours as needed for fever.   Yes [provider]  aspirin 81 MG chewable tablet Chew 1 tablet (81 mg total) by mouth daily. Patient taking differently: Chew 81 mg by mouth every evening.  09/25/16  Yes Laverda Page B, NP  clopidogrel (PLAVIX) 75 MG tablet Take 1 tablet (75  mg total) by mouth daily with breakfast. 09/25/16  Yes Laverda Pageoberts, Lindsay B, NP  Loperamide HCl (IMODIUM A-D) 1 MG/7.5ML LIQD Take 2 mg by mouth every 4 (four) hours as needed (diarrhea).   Yes [provider]  nitroGLYCERIN (NITROSTAT) 0.4 MG SL tablet Place 1 tablet (0.4 mg total) under the tongue every 5 (five) minutes as needed. Patient taking differently: Place 0.4 mg under the tongue every 5  (five) minutes as needed for chest pain.  10/07/16  Yes Marykay LexHarding, David W, MD  atorvastatin (LIPITOR) 80 MG tablet Take 1 tablet (80 mg total) by mouth daily at 6 PM. 09/24/16   Arty Baumgartneroberts, Lindsay B, NP  irbesartan (AVAPRO) 75 MG tablet Take 0.5 tablets (37.5 mg total) by mouth daily. 09/30/16   Manson PasseyBhagat, Bhavinkumar, PA    Family History Family History  Problem Relation Age of Onset  . Hypertension Mother   . Cancer Mother   . Cancer Father   . Breast cancer Sister     Social History Social History  Substance Use Topics  . Smoking status: Former Smoker    Quit date: 04/26/1987  . Smokeless tobacco: Never Used  . Alcohol use 0.5 oz/week    1 Standard drinks or equivalent per week     Allergies   Patient has no known allergies.   Review of Systems Review of Systems  Constitutional: Positive for activity change, appetite change, fatigue and fever.  HENT: Negative for sore throat.   Eyes: Negative for visual disturbance.  Respiratory: Negative for shortness of breath.   Cardiovascular: Negative for chest pain.  Gastrointestinal: Positive for abdominal pain, diarrhea and nausea. Negative for vomiting.  Genitourinary: Negative for difficulty urinating.  Musculoskeletal: Positive for myalgias. Negative for back pain and neck stiffness.  Skin: Negative for rash.  Neurological: Positive for syncope and light-headedness. Negative for headaches.     Physical Exam Updated Vital Signs BP 118/66 (BP Location: Right Arm)   Pulse 64   Temp 99.5 F (37.5 C) (Oral)   Resp 14   Ht 5\' 10"  (1.778 m)   Wt 88.9 kg (196 lb)   SpO2 98%   BMI 28.12 kg/m   Physical Exam  Constitutional: He is oriented to person, place, and time. He appears well-developed and well-nourished. No distress.  HENT:  Head: Normocephalic and atraumatic.  Dry mucus membranes  Eyes: Conjunctivae and EOM are normal.  Neck: Normal range of motion.  Cardiovascular: Normal rate, regular rhythm, normal heart sounds and  intact distal pulses.  Exam reveals no gallop and no friction rub.   No murmur heard. Pulmonary/Chest: Effort normal and breath sounds normal. No respiratory distress. He has no wheezes. He has no rales.  Abdominal: Soft. He exhibits no distension. There is no tenderness. There is no guarding.  Musculoskeletal: He exhibits no edema.  Neurological: He is alert and oriented to person, place, and time.  Skin: Skin is warm and dry. He is not diaphoretic.  Nursing note and vitals reviewed.    ED Treatments / Results  Labs (all labs ordered are listed, but only abnormal results are displayed) Labs Reviewed  COMPREHENSIVE METABOLIC PANEL - Abnormal; Notable for the following:       Result Value   Sodium 129 (*)    Potassium 3.4 (*)    Chloride 98 (*)    CO2 21 (*)    Glucose, Bld 114 (*)    All other components within normal limits  URINALYSIS, ROUTINE W REFLEX MICROSCOPIC - Abnormal; Notable  for the following:    Specific Gravity, Urine <1.005 (*)    All other components within normal limits  PHOSPHORUS - Abnormal; Notable for the following:    Phosphorus 2.4 (*)    All other components within normal limits  BASIC METABOLIC PANEL - Abnormal; Notable for the following:    Sodium 133 (*)    Calcium 8.5 (*)    All other components within normal limits  CBC - Abnormal; Notable for the following:    WBC 2.8 (*)    RBC 3.98 (*)    Hemoglobin 11.6 (*)    HCT 34.5 (*)    Platelets 122 (*)    All other components within normal limits  C DIFFICILE QUICK SCREEN W PCR REFLEX  GASTROINTESTINAL PANEL BY PCR, STOOL (REPLACES STOOL CULTURE)  LIPASE, BLOOD  CBC  MAGNESIUM    EKG  EKG Interpretation None       Radiology Dg Abd 2 Views  Result Date: 10/19/2016 CLINICAL DATA:  Abdominal pain and diarrhea x3 days EXAM: ABDOMEN - 2 VIEW COMPARISON:  Lumbar spine radiographs from 11/04/2011. FINDINGS: There is suggestion of possible "thumbprinting" along the distal ascending and proximal  transverse colon which may represent submucosal edema and possibly colitis. Scattered air containing small bowel loops in a nonspecific bowel gas pattern are noted. There is no free air. Chronic compression deformity of the lowest rib-bearing vertebral body previously stated as L1 on prior lumbar spine radiographs from 11/04/2011. No free air. A small calcification consistent with a phlebolith is seen in the left hemipelvis. IMPRESSION: Possible thumbprinting of the distal ascending and proximal transverse colon which may represent submucosal edema and changes of colitis. Electronically Signed   By: Tollie Eth M.D.   On: 10/19/2016 22:46    Procedures Procedures (including critical care time)  Medications Ordered in ED Medications  aspirin chewable tablet 81 mg (81 mg Oral Given 10/19/16 1827)  atorvastatin (LIPITOR) tablet 80 mg (80 mg Oral Given 10/19/16 1827)  clopidogrel (PLAVIX) tablet 75 mg (75 mg Oral Given 10/20/16 0946)  0.9 %  sodium chloride infusion ( Intravenous New Bag/Given 10/19/16 1828)  acetaminophen (TYLENOL) tablet 650 mg (not administered)    Or  acetaminophen (TYLENOL) suppository 650 mg (not administered)  ondansetron (ZOFRAN) tablet 4 mg (not administered)    Or  ondansetron (ZOFRAN) injection 4 mg (not administered)  0.9 %  sodium chloride infusion ( Intravenous New Bag/Given 10/20/16 1058)  temazepam (RESTORIL) capsule 15 mg (not administered)  HYDROcodone-acetaminophen (NORCO/VICODIN) 5-325 MG per tablet 1 tablet (not administered)  metroNIDAZOLE (FLAGYL) IVPB 500 mg (0 mg Intravenous Stopped 10/20/16 1158)  cefTRIAXone (ROCEPHIN) 2 g in dextrose 5 % 50 mL IVPB (2 g Intravenous New Bag/Given 10/20/16 1302)  ondansetron (ZOFRAN-ODT) disintegrating tablet 4 mg ( Oral Not Given 10/19/16 1751)  sodium chloride 0.9 % bolus 1,000 mL (0 mLs Intravenous Stopped 10/19/16 1652)  potassium chloride SA (K-DUR,KLOR-CON) CR tablet 40 mEq (40 mEq Oral Given 10/19/16 1535)  sodium phosphate 10 mmol  in dextrose 5 % 250 mL infusion (10 mmol Intravenous New Bag/Given 10/19/16 1832)     Initial Impression / Assessment and Plan / ED Course  I have reviewed the triage vital signs and the nursing notes.  Pertinent labs & imaging results that were available during my care of the patient were reviewed by me and considered in my medical decision making (see chart for details).     71yo male with history of STEMI 1 month ago, hyperlipidemia,  presents with concern for severe diarrhea for 3 days, syncopal episode, generalized weakness.  Abdominal exam benign, doubt appendicitis, diverticulitis, obstruction, mesenteric ischemia.  Labs significant for sodium decrease from 139 to 129.  Suspect this is likely secondary to dehydration from diarrhea. Ordered stool studies.  Suspect syncopal episode secondary to dehydration/vasovagal, however patient has had recent stemi.  Will admit for continued hydration, telemetry monitoring, recheck electrolytes in setting of dehydration, hyponatremia and syncope.  Final Clinical Impressions(s) / ED Diagnoses   Final diagnoses:  Syncope, unspecified syncope type  Dehydration  Hyponatremia  Diarrhea of presumed infectious origin    New Prescriptions Current Discharge Medication List       Alvira MondaySchlossman, Shishir Krantz, MD 10/20/16 1323

## 2016-10-19 NOTE — ED Triage Notes (Signed)
Pt reports having MI 4 weeks ago and was feeling fine until Friday morning when he started having watery diarrhea. Has  Nausea, no vomiting. Reports being very fatigued and weak.

## 2016-10-19 NOTE — Telephone Encounter (Signed)
Got page from answsering service but this was mistaken page. [facility] but Dr Marchelle Gearingamaswamy of [facility] got paged. Wife said this is an error. PAtient has diarrhea and is weak. She is taking him to [place] right now and did not feel I needed to forward the page to anyone  Dr. Kalman ShanMurali Ole Lafon, M.D., [facility] Pulmonary and Critical Care Medicine Staff Physician  System [facility] Pager: [phone], If no answer or between  15:00h - 7:00h: call [phone]  10/19/2016 12:27 PM

## 2016-10-20 ENCOUNTER — Telehealth: Payer: Self-pay | Admitting: Cardiology

## 2016-10-20 DIAGNOSIS — E860 Dehydration: Secondary | ICD-10-CM | POA: Diagnosis not present

## 2016-10-20 DIAGNOSIS — I252 Old myocardial infarction: Secondary | ICD-10-CM | POA: Diagnosis not present

## 2016-10-20 DIAGNOSIS — I110 Hypertensive heart disease with heart failure: Secondary | ICD-10-CM | POA: Diagnosis present

## 2016-10-20 DIAGNOSIS — R55 Syncope and collapse: Secondary | ICD-10-CM

## 2016-10-20 DIAGNOSIS — E871 Hypo-osmolality and hyponatremia: Secondary | ICD-10-CM | POA: Diagnosis not present

## 2016-10-20 DIAGNOSIS — R197 Diarrhea, unspecified: Secondary | ICD-10-CM

## 2016-10-20 DIAGNOSIS — Z9861 Coronary angioplasty status: Secondary | ICD-10-CM | POA: Diagnosis not present

## 2016-10-20 DIAGNOSIS — K529 Noninfective gastroenteritis and colitis, unspecified: Secondary | ICD-10-CM

## 2016-10-20 DIAGNOSIS — A020 Salmonella enteritis: Secondary | ICD-10-CM | POA: Diagnosis not present

## 2016-10-20 DIAGNOSIS — E785 Hyperlipidemia, unspecified: Secondary | ICD-10-CM | POA: Diagnosis present

## 2016-10-20 DIAGNOSIS — Z79899 Other long term (current) drug therapy: Secondary | ICD-10-CM | POA: Diagnosis not present

## 2016-10-20 DIAGNOSIS — I2119 ST elevation (STEMI) myocardial infarction involving other coronary artery of inferior wall: Secondary | ICD-10-CM | POA: Diagnosis present

## 2016-10-20 DIAGNOSIS — Z7982 Long term (current) use of aspirin: Secondary | ICD-10-CM | POA: Diagnosis not present

## 2016-10-20 DIAGNOSIS — I2510 Atherosclerotic heart disease of native coronary artery without angina pectoris: Secondary | ICD-10-CM | POA: Diagnosis present

## 2016-10-20 DIAGNOSIS — A09 Infectious gastroenteritis and colitis, unspecified: Secondary | ICD-10-CM

## 2016-10-20 DIAGNOSIS — Z7902 Long term (current) use of antithrombotics/antiplatelets: Secondary | ICD-10-CM | POA: Diagnosis not present

## 2016-10-20 DIAGNOSIS — E876 Hypokalemia: Secondary | ICD-10-CM | POA: Diagnosis present

## 2016-10-20 DIAGNOSIS — Z8249 Family history of ischemic heart disease and other diseases of the circulatory system: Secondary | ICD-10-CM | POA: Diagnosis not present

## 2016-10-20 DIAGNOSIS — Z87891 Personal history of nicotine dependence: Secondary | ICD-10-CM | POA: Diagnosis not present

## 2016-10-20 DIAGNOSIS — I5032 Chronic diastolic (congestive) heart failure: Secondary | ICD-10-CM | POA: Diagnosis present

## 2016-10-20 LAB — BASIC METABOLIC PANEL
Anion gap: 7 (ref 5–15)
BUN: 8 mg/dL (ref 6–20)
CHLORIDE: 104 mmol/L (ref 101–111)
CO2: 22 mmol/L (ref 22–32)
Calcium: 8.5 mg/dL — ABNORMAL LOW (ref 8.9–10.3)
Creatinine, Ser: 0.86 mg/dL (ref 0.61–1.24)
GFR calc Af Amer: >60 mL/min (ref >60)
GFR calc non Af Amer: >60 mL/min (ref >60)
GLUCOSE: 93 mg/dL (ref 65–99)
POTASSIUM: 3.5 mmol/L (ref 3.5–5.1)
Sodium: 133 mmol/L — ABNORMAL LOW (ref 135–145)

## 2016-10-20 LAB — GASTROINTESTINAL PANEL BY PCR, STOOL (REPLACES STOOL CULTURE)
ADENOVIRUS F40/41: NOT DETECTED
ASTROVIRUS: NOT DETECTED
CAMPYLOBACTER SPECIES: NOT DETECTED
CYCLOSPORA CAYETANENSIS: NOT DETECTED
Cryptosporidium: NOT DETECTED
ENTAMOEBA HISTOLYTICA: NOT DETECTED
ENTEROPATHOGENIC E COLI (EPEC): NOT DETECTED
Enteroaggregative E coli (EAEC): NOT DETECTED
Enterotoxigenic E coli (ETEC): NOT DETECTED
Giardia lamblia: NOT DETECTED
NOROVIRUS GI/GII: NOT DETECTED
Plesimonas shigelloides: NOT DETECTED
Rotavirus A: NOT DETECTED
SAPOVIRUS (I, II, IV, AND V): NOT DETECTED
SHIGA LIKE TOXIN PRODUCING E COLI (STEC): NOT DETECTED
Salmonella species: DETECTED — AB
Shigella/Enteroinvasive E coli (EIEC): NOT DETECTED
VIBRIO CHOLERAE: NOT DETECTED
VIBRIO SPECIES: NOT DETECTED
Yersinia enterocolitica: NOT DETECTED

## 2016-10-20 LAB — CBC
HEMATOCRIT: 34.5 % — AB (ref 39.0–52.0)
Hemoglobin: 11.6 g/dL — ABNORMAL LOW (ref 13.0–17.0)
MCH: 29.1 pg (ref 26.0–34.0)
MCHC: 33.6 g/dL (ref 30.0–36.0)
MCV: 86.7 fL (ref 78.0–100.0)
Platelets: 122 10*3/uL — ABNORMAL LOW (ref 150–400)
RBC: 3.98 MIL/uL — ABNORMAL LOW (ref 4.22–5.81)
RDW: 13.3 % (ref 11.5–15.5)
WBC: 2.8 10*3/uL — ABNORMAL LOW (ref 4.0–10.5)

## 2016-10-20 MED ORDER — TEMAZEPAM 15 MG PO CAPS
15.0000 mg | ORAL_CAPSULE | Freq: Every evening | ORAL | Status: DC | PRN
  Administered 2016-10-20: 15 mg via ORAL
  Filled 2016-10-20: qty 1

## 2016-10-20 MED ORDER — POTASSIUM PHOSPHATES 15 MMOLE/5ML IV SOLN
30.0000 mmol | Freq: Once | INTRAVENOUS | Status: DC
  Filled 2016-10-20: qty 10

## 2016-10-20 MED ORDER — DEXTROSE 5 % IV SOLN
2.0000 g | INTRAVENOUS | Status: DC
  Administered 2016-10-20: 2 g via INTRAVENOUS
  Filled 2016-10-20 (×2): qty 2

## 2016-10-20 MED ORDER — HYDROCODONE-ACETAMINOPHEN 5-325 MG PO TABS
1.0000 | ORAL_TABLET | ORAL | Status: DC | PRN
  Administered 2016-10-20: 1 via ORAL
  Filled 2016-10-20: qty 1

## 2016-10-20 MED ORDER — SODIUM CHLORIDE 0.9 % IV SOLN
INTRAVENOUS | Status: AC
  Administered 2016-10-20 (×2): via INTRAVENOUS

## 2016-10-20 MED ORDER — METRONIDAZOLE IN NACL 5-0.79 MG/ML-% IV SOLN
500.0000 mg | Freq: Three times a day (TID) | INTRAVENOUS | Status: DC
  Administered 2016-10-20 – 2016-10-21 (×3): 500 mg via INTRAVENOUS
  Filled 2016-10-20 (×4): qty 100

## 2016-10-20 NOTE — Telephone Encounter (Signed)
New message    Pt wife does not want APP  Pt c/o Syncope: STAT if syncope occurred within 30 minutes and pt complains of lightheadedness High Priority if episode of passing out, completely, today or in last 24 hours   1. Did you pass out today?  No in [place] [facility]   2. When is the last time you passed out? friday  3. Has this occurred multiple times?  Yes when he gets hot  4. Did you have any symptoms prior to passing out? dizziness

## 2016-10-20 NOTE — Telephone Encounter (Signed)
Spoke with patients wife. He is currently admitted and she is not sure what is causing his syncope or how long he will be in the hospital. She will call back to schedule follow up when he gets discharged from hospital.

## 2016-10-20 NOTE — Progress Notes (Signed)
PROGRESS NOTE    Noell Shular  JWJ:[id number] DOB: [birth year] DOA: 10/19/2016 PCP: Alysia Penna, MD   Outpatient Specialists:     Brief Narrative:  Damarius Karnes is a 71 y.o. male with a Past Medical History sig for recent CAD/STEMI who presents with syncope from volume loss/diarrhea.   Assessment & Plan:   Principal Problem:   Syncope Active Problems:   STEMI involving oth coronary artery of inferior wall (HCC)   CAD S/P percutaneous coronary angioplasty   Diarrhea   Dehydration   Hyponatremia   Syncope.  -secondary to decreased volume and dehydration in the setting of persistent watery diarrhea and decreased oral intake and anti-hypertensive medication. -continue IVF -hold BP medications  Diarrhea caused by colitis -GI pathogen panel -C. difficile PCR negative -2 view abdominal film- thumbprinting and edema -advance diet as tolerated -flagyl/rocephin -considering GI consult   Hyponatremia. 129 -IV fluids as noted above  Dehydration -IVF  CAD/recent STEMI. Status post percutaneous coronary angioplasty -Continue home statin -Continue Plavix -Continue aspirin -hold BP meds   DVT prophylaxis:  SCD's  Code Status: Full Code  Family Communication: Wife at bedside  Disposition Plan:     Consultants:         Subjective: Still with cramping and diarrhea No nausea asking for diet advancement  Objective: Vitals:   10/20/16 0013 10/20/16 0311 10/20/16 0737 10/20/16 1132  BP: (!) 107/59 107/62 (!) 97/59 118/66  Pulse: 70 70 64   Resp: 20 (!) 25 17 14   Temp: 99.9 F (37.7 C) 99.1 F (37.3 C) 99.5 F (37.5 C)   TempSrc: Oral Oral Oral   SpO2: 98% 98% 98%   Weight:      Height:        Intake/Output Summary (Last 24 hours) at 10/20/16 1206 Last data filed at 10/20/16 0900  Gross per 24 hour  Intake             1360 ml  Output              275 ml  Net             1085 ml   Filed Weights   10/19/16 1758  Weight: 88.9 kg  (196 lb)    Examination:  General exam: Appears calm and comfortable  Respiratory system: Clear to auscultation. Respiratory effort normal. Cardiovascular system: S1 & S2 heard, RRR. No JVD, murmurs, rubs, gallops or clicks. No pedal edema. Gastrointestinal system: Abdomen is nondistended, soft and nontender. No organomegaly or masses felt. Normal bowel sounds heard. Central nervous system: Alert and oriented. No focal neurological deficits. Extremities: Symmetric 5 x 5 power. Skin: No rashes, lesions or ulcers Psychiatry: Judgement and insight appear normal. Mood & affect appropriate.     Data Reviewed: I have personally reviewed following labs and imaging studies  CBC:  Recent Labs Lab 10/19/16 1322 10/20/16 0652  WBC 4.0 2.8*  HGB 14.4 11.6*  HCT 42.7 34.5*  MCV 87.7 86.7  PLT 164 122*   Basic Metabolic Panel:  Recent Labs Lab 10/19/16 1322 10/20/16 0652  NA 129* 133*  K 3.4* 3.5  CL 98* 104  CO2 21* 22  GLUCOSE 114* 93  BUN 13 8  CREATININE 0.97 0.86  CALCIUM 9.1 8.5*  MG 1.8  --   PHOS 2.4*  --    GFR: Estimated Creatinine Clearance: 91 mL/min (by C-G formula based on SCr of 0.86 mg/dL). Liver Function Tests:  Recent Labs Lab 10/19/16 1322  AST 34  ALT 39  ALKPHOS 73  BILITOT 0.8  PROT 6.9  ALBUMIN 3.9    Recent Labs Lab 10/19/16 1322  LIPASE 21   No results for input(s): AMMONIA in the last 168 hours. Coagulation Profile: No results for input(s): INR, PROTIME in the last 168 hours. Cardiac Enzymes: No results for input(s): CKTOTAL, CKMB, CKMBINDEX, TROPONINI in the last 168 hours. BNP (last 3 results) No results for input(s): PROBNP in the last 8760 hours. HbA1C: No results for input(s): HGBA1C in the last 72 hours. CBG: No results for input(s): GLUCAP in the last 168 hours. Lipid Profile: No results for input(s): CHOL, HDL, LDLCALC, TRIG, CHOLHDL, LDLDIRECT in the last 72 hours. Thyroid Function Tests: No results for input(s):  TSH, T4TOTAL, FREET4, T3FREE, THYROIDAB in the last 72 hours. Anemia Panel: No results for input(s): VITAMINB12, FOLATE, FERRITIN, TIBC, IRON, RETICCTPCT in the last 72 hours. Urine analysis:    Component Value Date/Time   COLORURINE YELLOW 10/19/2016 1625   APPEARANCEUR CLEAR 10/19/2016 1625   LABSPEC <1.005 (L) 10/19/2016 1625   PHURINE 6.5 10/19/2016 1625   GLUCOSEU NEGATIVE 10/19/2016 1625   HGBUR NEGATIVE 10/19/2016 1625   BILIRUBINUR NEGATIVE 10/19/2016 1625   BILIRUBINUR n 04/25/2010   KETONESUR NEGATIVE 10/19/2016 1625   PROTEINUR NEGATIVE 10/19/2016 1625   UROBILINOGEN 0.2 04/25/2010   NITRITE NEGATIVE 10/19/2016 1625   LEUKOCYTESUR NEGATIVE 10/19/2016 1625    ) Recent Results (from the past 240 hour(s))  C difficile quick scan w PCR reflex     Status: None   Collection Time: 10/19/16  4:25 PM  Result Value Ref Range Status   C Diff antigen NEGATIVE NEGATIVE Final   C Diff toxin NEGATIVE NEGATIVE Final   C Diff interpretation No C. difficile detected.  Final      Anti-infectives    Start     Dose/Rate Route Frequency Ordered Stop   10/20/16 1300  cefTRIAXone (ROCEPHIN) 2 g in dextrose 5 % 50 mL IVPB     2 g 100 mL/hr over 30 Minutes Intravenous Every 24 hours 10/20/16 1157     10/20/16 1000  metroNIDAZOLE (FLAGYL) IVPB 500 mg     500 mg 100 mL/hr over 60 Minutes Intravenous Every 8 hours 10/20/16 0950         Radiology Studies: Dg Abd 2 Views  Result Date: 10/19/2016 CLINICAL DATA:  Abdominal pain and diarrhea x3 days EXAM: ABDOMEN - 2 VIEW COMPARISON:  Lumbar spine radiographs from 11/04/2011. FINDINGS: There is suggestion of possible "thumbprinting" along the distal ascending and proximal transverse colon which may represent submucosal edema and possibly colitis. Scattered air containing small bowel loops in a nonspecific bowel gas pattern are noted. There is no free air. Chronic compression deformity of the lowest rib-bearing vertebral body previously stated  as L1 on prior lumbar spine radiographs from 11/04/2011. No free air. A small calcification consistent with a phlebolith is seen in the left hemipelvis. IMPRESSION: Possible thumbprinting of the distal ascending and proximal transverse colon which may represent submucosal edema and changes of colitis. Electronically Signed   By: Tollie Ethavid  Kwon M.D.   On: 10/19/2016 22:46        Scheduled Meds: . aspirin  81 mg Oral Daily  . atorvastatin  80 mg Oral q1800  . clopidogrel  75 mg Oral Q breakfast   Continuous Infusions: . sodium chloride 75 mL/hr at 10/20/16 1058  . cefTRIAXone (ROCEPHIN)  IV    . metronidazole 500 mg (10/20/16 1058)  LOS: 0 days    Time spent: 35 min    Irlanda Croghan U Narissa Beaufort, DO [facility] Pager [phone]  If 7PM-7AM, please contact night-coverage [email or web address] Password [email or web address] 10/20/2016, 12:06 PM

## 2016-10-20 NOTE — Consult Note (Signed)
Marcus Mills 71 y.o. Apr 17, 1945 [id number]  Assessment & Plan:   1. Diarrhea, secondary to infection bacterial vs. Viral 2. Acute colitis  Because Marcus Mills is gradually improving we will hold off on any further testing.  The drop in hemoglobin is most likely a dilution effect from rehydration and should self resolve.   Await GI pathogen PCR results  Continue supportive care with IV fluids  Continue IV Flagyl  Advance diet as tolerated    F/U CBC, WBC (low)   I have personally seen the patient, reviewed and repeated key elements of the history and physical and participated in formation of the assessment and plan the student has documented.  We will f/u tomorrow.  Iva Boop, MD, [facility] [phone] (pager) 10/20/2016 5:36 PM      Subjective:   Chief Complaint: Diarrhea  HPI Marcus Mills is a very pleasant 71 yo gentleman with past medical history significant for recent hospitalization for CAD/STEMI from 09/22/16- 09/24/16 was seen for consult of recent sudden onset diarrhea with syncope.  He had been recovering well from STEMI until new onset excessive watery diarrhea on 10/17/16.  He was previously well and denies any sickness or known sick contacts prior to diarrhea onset.  He has not been on any recent abx.  Denies history or IBD, IBS.    He states the diarrhea came on suddenly on 10/17/16 and he had to come to the hospital on 10/19/16 due to no improvement of symptoms and increased syncope.  Upon admission, stool testing was done and C-diff is negative, other PCR panel is still pending.  Abdominal x-ray identified: Possible thumbprinting of the distal ascending and proximaltransverse colon which may represent submucosal edema and changes of Colitis.    CBC: 8/5 Hem: 14.4, 8/6 Hem 11.6  Today he says he is feeling much better but continues to have excessive watery diarrhea with urgency.  He denies incontinence.  His pain is improved but  continues to have intermittent diffuse abdominal cramping pain.  He denies nausea, vomiting.  He had a subjective fever at home with chills and diaphoresis, those have since resolved since being in the hospital.  Denies hematochezia, melena, hematemesis. He has tolerated liquids and some soft foods including yogurt, chicken broth. Last colonoscopy in [facility] w/in past few years and negative  No Known Allergies Current Meds  Medication Sig  . acetaminophen (TYLENOL) 500 MG tablet Take 1,000 mg by mouth every 4 (four) hours as needed for fever.  Marland Kitchen aspirin 81 MG chewable tablet Chew 1 tablet (81 mg total) by mouth daily. (Patient taking differently: Chew 81 mg by mouth every evening. )  . clopidogrel (PLAVIX) 75 MG tablet Take 1 tablet (75 mg total) by mouth daily with breakfast.  . Loperamide HCl (IMODIUM A-D) 1 MG/7.5ML LIQD Take 2 mg by mouth every 4 (four) hours as needed (diarrhea).  . nitroGLYCERIN (NITROSTAT) 0.4 MG SL tablet Place 1 tablet (0.4 mg total) under the tongue every 5 (five) minutes as needed. (Patient taking differently: Place 0.4 mg under the tongue every 5 (five) minutes as needed for chest pain. )   Past Medical History:  Diagnosis Date  . ALLERGIC RHINITIS 11/04/2006  . CAD S/P percutaneous coronary angioplasty 09/21/2016   PTCA of dRCA/rPAV after rPDA 100% to 30%. Not favorable for Stent 50% p-mLAD & dRCA.  Marland Kitchen Chronic diastolic CHF (congestive heart failure) (HCC)   . HYPERLIPIDEMIA 11/04/2006   Past Surgical History:  Procedure Laterality Date  .  CORONARY BALLOON ANGIOPLASTY N/A 09/22/2016   Procedure: Coronary Balloon Angioplasty;  Surgeon: Lyn RecordsSmith, Henry W, MD;  [facility];  Service: Cardiovascular;  Laterality: N/A;  PLA  . LEFT HEART CATH AND CORONARY ANGIOGRAPHY N/A 09/22/2016   Procedure: Left Heart Cath and Coronary Angiography;  Surgeon: Lyn RecordsSmith, Henry W, MD;  [facility];  Service: Cardiovascular;  Laterality: N/A;  . TONSILLECTOMY        family history includes Breast cancer in his sister; Cancer in his father and mother; Hypertension in his mother.   Review of Systems GI: positive for cramping pain, watery diarrhea (7-8x since 8am 10/20/2016) All other systems negative See HPI for details   Objective:   Physical Exam @BP  118/66 (BP Location: Right Arm)   Pulse 64   Temp 99.5 F (37.5 C) (Oral)   Resp 14   Ht 5\' 10"  (1.778 m)   Wt 196 lb (88.9 kg)   SpO2 98%   BMI 28.12 kg/m @  General:  Well-developed, well-nourished and in no acute distress Eyes:  anicteric. ENT:   Mouth and posterior pharynx free of lesions.  Neck:   supple w/o thyromegaly or mass.  Lungs: Clear to auscultation bilaterally. Heart:   S1S2, no rubs, murmurs, gallops. Abdomen:  soft, no hepatosplenomegaly, hernia, or mass and hyperactive bowel sounds. Mild ttp RLQ, no   rebound or guarding.  Rectal: Not examined Lymph:  no cervical or supraclavicular adenopathy. Extremities:   no edema, cyanosis or clubbing Skin   no rash. Neuro:  A&O x 3.  Psych:  appropriate mood and  Affect.   Data Reviewed: CBC Latest Ref Rng & Units 10/20/2016 10/19/2016 09/22/2016  WBC 4.0 - 10.5 K/uL 2.8(L) 4.0 8.3  Hemoglobin 13.0 - 17.0 g/dL 11.6(L) 14.4 12.0(L)  Hematocrit 39.0 - 52.0 % 34.5(L) 42.7 36.8(L)  Platelets 150 - 400 K/uL 122(L) 164 157   Abdominal x-ray 10/19/16 IMPRESSION: Possible thumbprinting of the distal ascending and proximal transverse colon which may represent submucosal edema and changes of Colitis.  Stool Testing C-diff Negative Other PCR pending  Dr. Burna SisVann's notes Toya SmothersKaren Black, NP's notes Emergency Room notes  Flint MelterKristen Moore, PA-S [facility]

## 2016-10-21 DIAGNOSIS — A020 Salmonella enteritis: Principal | ICD-10-CM

## 2016-10-21 DIAGNOSIS — E860 Dehydration: Secondary | ICD-10-CM

## 2016-10-21 LAB — BASIC METABOLIC PANEL
ANION GAP: 8 (ref 5–15)
BUN: 5 mg/dL — AB (ref 6–20)
CALCIUM: 8.3 mg/dL — AB (ref 8.9–10.3)
CO2: 23 mmol/L (ref 22–32)
Chloride: 105 mmol/L (ref 101–111)
Creatinine, Ser: 0.72 mg/dL (ref 0.61–1.24)
GFR calc Af Amer: >60 mL/min (ref >60)
GFR calc non Af Amer: >60 mL/min (ref >60)
GLUCOSE: 101 mg/dL — AB (ref 65–99)
Potassium: 3.3 mmol/L — ABNORMAL LOW (ref 3.5–5.1)
Sodium: 136 mmol/L (ref 135–145)

## 2016-10-21 LAB — CBC
HCT: 32.6 % — ABNORMAL LOW (ref 39.0–52.0)
Hemoglobin: 11.1 g/dL — ABNORMAL LOW (ref 13.0–17.0)
MCH: 29.9 pg (ref 26.0–34.0)
MCHC: 34 g/dL (ref 30.0–36.0)
MCV: 87.9 fL (ref 78.0–100.0)
Platelets: 127 10*3/uL — ABNORMAL LOW (ref 150–400)
RBC: 3.71 MIL/uL — ABNORMAL LOW (ref 4.22–5.81)
RDW: 13.9 % (ref 11.5–15.5)
WBC: 3 10*3/uL — ABNORMAL LOW (ref 4.0–10.5)

## 2016-10-21 MED ORDER — LEVOFLOXACIN 500 MG PO TABS: 500.0000 mg | ORAL_TABLET | Freq: Every day | ORAL | 0 refills | Status: DC

## 2016-10-21 MED ORDER — POTASSIUM CHLORIDE CRYS ER 20 MEQ PO TBCR: 40.0000 meq | EXTENDED_RELEASE_TABLET | Freq: Once | ORAL | Status: DC

## 2016-10-21 MED ORDER — LEVOFLOXACIN 500 MG PO TABS
500.0000 mg | ORAL_TABLET | Freq: Every day | ORAL | Status: DC
  Administered 2016-10-21: 500 mg via ORAL
  Filled 2016-10-21: qty 1

## 2016-10-21 MED ORDER — SODIUM CHLORIDE 0.9 % IV SOLN
INTRAVENOUS | Status: DC
  Administered 2016-10-21: 08:00:00 via INTRAVENOUS

## 2016-10-21 NOTE — Discharge Summary (Signed)
Physician Discharge Summary  Marcus Mills [id number] DOB: 05/20/1945 DOA: 10/19/2016  PCP: Alysia PennaHolwerda, Scott, MD  Admit date: 10/19/2016 Discharge date: 10/21/2016   Recommendations for Outpatient Follow-Up:   1. BP check 3-5 days-- resume BP Meds after improvement 2. Encourage PO intake 3. Cbc 1 week re dec plts and WBC   Discharge Diagnosis:   Principal Problem:   Syncope Active Problems:   STEMI involving oth coronary artery of inferior wall (HCC)   CAD S/P percutaneous coronary angioplasty   Diarrhea   Dehydration   Hyponatremia   Discharge disposition:  Home.   Discharge Condition: Improved.  Diet recommendation: BRAT- advance as tolerated  Wound care: None.   History of Present Illness:   Marcus Mills is a 71 y.o. male with a Past Medical History sig for CAD/STEMI who presents with syncope from volume loss.   Hospital Course by Problem:   Syncope -secondary to decreased volume and dehydration in the setting of persistent watery diarrhea and decreased oral intake and anti-hypertensive medication. 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 difficile PCR negative -2 view abdominal film- thumbprinting and edema -discussed with GI- short course of levaquin   Hyponatremia.  -resolved with IVF  Dehydration -IVF -resolved  CAD/recent STEMI.Status post percutaneous coronary angioplasty -Continue home statin -Continue Plavix -Continue aspirin -hold BP meds  Hypokalemia -replaced    Medical Consultants:    GI   Discharge Exam:   Vitals:   10/21/16 0814 10/21/16 1100  BP: 114/70 102/65  Pulse: 61 60  Resp: 17 14  Temp: 98.4 F (36.9 C) 97.9 F (36.6 C)   Vitals:   10/21/16 0028 10/21/16 0656 10/21/16 0814 10/21/16 1100  BP: 109/62 (!) 100/59 114/70 102/65  Pulse: 74 70 61 60  Resp: 12  17 14   Temp:  98 F (36.7 C) 98.4 F (36.9 C)  97.9 F (36.6 C)  TempSrc:  Oral Oral Oral  SpO2: 98%  98% 98%  Weight:  85 kg (187 lb 6.4 oz)    Height:        Gen:  NAD- feeling better, anxious to get home   The results of significant diagnostics from this hospitalization (including imaging, microbiology, ancillary and laboratory) are listed below for reference.     Procedures and Diagnostic Studies:   Dg Abd 2 Views  Result Date: 10/19/2016 CLINICAL DATA:  Abdominal pain and diarrhea x3 days EXAM: ABDOMEN - 2 VIEW COMPARISON:  Lumbar spine radiographs from 11/04/2011. FINDINGS: There is suggestion of possible "thumbprinting" along the distal ascending and proximal transverse colon which may represent submucosal edema and possibly colitis. Scattered air containing small bowel loops in a nonspecific bowel gas pattern are noted. There is no free air. Chronic compression deformity of the lowest rib-bearing vertebral body previously stated as L1 on prior lumbar spine radiographs from 11/04/2011. No free air. A small calcification consistent with a phlebolith is seen in the left hemipelvis. IMPRESSION: Possible thumbprinting of the distal ascending and proximal transverse colon which may represent submucosal edema and changes of colitis. Electronically Signed   By: Tollie Ethavid  Kwon M.D.   On: 10/19/2016 22:46     Labs:   Basic Metabolic Panel:  Recent Labs Lab 10/19/16 1322 10/20/16 0652 10/21/16 0745  NA 129* 133* 136  K 3.4* 3.5 3.3*  CL 98* 104 105  CO2 21* 22 23  GLUCOSE 114* 93 101*  BUN  13 8 5*  CREATININE 0.97 0.86 0.72  CALCIUM 9.1 8.5* 8.3*  MG 1.8  --   --   PHOS 2.4*  --   --    GFR Estimated Creatinine Clearance: 90 mL/min (by C-G formula based on SCr of 0.72 mg/dL). Liver Function Tests:  Recent Labs Lab 10/19/16 1322  AST 34  ALT 39  ALKPHOS 73  BILITOT 0.8  PROT 6.9  ALBUMIN 3.9    Recent Labs Lab 10/19/16 1322  LIPASE 21   No results for input(s): AMMONIA in the last 168 hours. Coagulation  profile No results for input(s): INR, PROTIME in the last 168 hours.  CBC:  Recent Labs Lab 10/19/16 1322 10/20/16 0652 10/21/16 0745  WBC 4.0 2.8* 3.0*  HGB 14.4 11.6* 11.1*  HCT 42.7 34.5* 32.6*  MCV 87.7 86.7 87.9  PLT 164 122* 127*   Cardiac Enzymes: No results for input(s): CKTOTAL, CKMB, CKMBINDEX, TROPONINI in the last 168 hours. BNP: Invalid input(s): POCBNP CBG: No results for input(s): GLUCAP in the last 168 hours. D-Dimer No results for input(s): DDIMER in the last 72 hours. Hgb A1c No results for input(s): HGBA1C in the last 72 hours. Lipid Profile No results for input(s): CHOL, HDL, LDLCALC, TRIG, CHOLHDL, LDLDIRECT in the last 72 hours. Thyroid function studies No results for input(s): TSH, T4TOTAL, T3FREE, THYROIDAB in the last 72 hours.  Invalid input(s): FREET3 Anemia work up No results for input(s): VITAMINB12, FOLATE, FERRITIN, TIBC, IRON, RETICCTPCT in the last 72 hours. Microbiology Recent Results (from the past 240 hour(s))  C difficile quick scan w PCR reflex     Status: None   Collection Time: 10/19/16  4:25 PM  Result Value Ref Range Status   C Diff antigen NEGATIVE NEGATIVE Final   C Diff toxin NEGATIVE NEGATIVE Final   C Diff interpretation No C. difficile detected.  Final  Gastrointestinal Panel by PCR , Stool     Status: Abnormal   Collection Time: 10/19/16  4:25 PM  Result Value Ref Range Status   Campylobacter species NOT DETECTED NOT DETECTED Final   Plesimonas shigelloides NOT DETECTED NOT DETECTED Final   Salmonella species DETECTED (A) NOT DETECTED Final    Comment: RESULT CALLED TO, READ BACK BY AND VERIFIED WITH: CESEAR KOLUMBO ON 10/20/16 AT 2[facility]    Yersinia enterocolitica NOT DETECTED NOT DETECTED Final   Vibrio species NOT DETECTED NOT DETECTED Final   Vibrio cholerae NOT DETECTED NOT DETECTED Final   Enteroaggregative E coli (EAEC) NOT DETECTED NOT DETECTED Final   Enteropathogenic E coli (EPEC) NOT DETECTED NOT  DETECTED Final   Enterotoxigenic E coli (ETEC) NOT DETECTED NOT DETECTED Final   Shiga like toxin producing E coli (STEC) NOT DETECTED NOT DETECTED Final   Shigella/Enteroinvasive E coli (EIEC) NOT DETECTED NOT DETECTED Final   Cryptosporidium NOT DETECTED NOT DETECTED Final   Cyclospora cayetanensis NOT DETECTED NOT DETECTED Final   Entamoeba histolytica NOT DETECTED NOT DETECTED Final   Giardia lamblia NOT DETECTED NOT DETECTED Final   Adenovirus F40/41 NOT DETECTED NOT DETECTED Final   Astrovirus NOT DETECTED NOT DETECTED Final   Norovirus GI/GII NOT DETECTED NOT DETECTED Final   Rotavirus A NOT DETECTED NOT DETECTED Final   Sapovirus (I, II, IV, and V) NOT DETECTED NOT DETECTED Final     Discharge Instructions:   Discharge Instructions    Discharge instructions    Complete by:  As directed    BRAT diet until diarrhea resolved-- see attached information  Be sure to stay hydrated with water/perdilyte/Gatorade/Propel   Increase activity slowly    Complete by:  As directed      Allergies as of 10/21/2016   No Known Allergies     Medication List    STOP taking these medications   irbesartan 75 MG tablet Commonly known as:  AVAPRO     TAKE these medications   acetaminophen 500 MG tablet Commonly known as:  TYLENOL Take 1,000 mg by mouth every 4 (four) hours as needed for fever.   aspirin 81 MG chewable tablet Chew 1 tablet (81 mg total) by mouth daily. What changed:  when to take this   atorvastatin 80 MG tablet Commonly known as:  LIPITOR Take 1 tablet (80 mg total) by mouth daily at 6 PM.   clopidogrel 75 MG tablet Commonly known as:  PLAVIX Take 1 tablet (75 mg total) by mouth daily with breakfast.   IMODIUM A-D 1 MG/7.5ML Liqd Generic drug:  Loperamide HCl Take 2 mg by mouth every 4 (four) hours as needed (diarrhea).   levofloxacin 500 MG tablet Commonly known as:  LEVAQUIN Take 1 tablet (500 mg total) by mouth daily.   nitroGLYCERIN 0.4 MG SL  tablet Commonly known as:  NITROSTAT Place 1 tablet (0.4 mg total) under the tongue every 5 (five) minutes as needed. What changed:  reasons to take this      Follow-up Information    Alysia Penna, MD Follow up in 5 day(s).   Specialty:  Internal Medicine Why:  for BP check-- hold BP meds until seen by PCP Or cardiology Contact information: [address] [place] [address] [phone]        Iva Boop, MD Follow up.   Specialty:  Gastroenterology Why:  PRN Contact information: [address] [place] [address] [phone]            Time coordinating discharge: 35 min  Signed:  Racine Erby U Rey Dansby   [facility] 10/21/2016, 1:12 PM

## 2016-10-21 NOTE — Care Management Note (Signed)
Case Management Note  Patient Details  Name: Marcus Mills MRN: [id number] Date of Birth: 01/18/1946  Subjective/Objective:    Syncope, diarrhea caused by salmonella colitis, hyponatremia               Action/Plan: Discharge Planning: Chart reviewed. No NCM needs identified.   PCP Alysia PennaHOLWERDA, SCOTT MD  Expected Discharge Date:  10/21/16               Expected Discharge Plan:  Home/Self Care  In-House Referral:  NA  Discharge planning Services  CM Consult  Post Acute Care Choice:  NA Choice offered to:  NA  DME Arranged:  N/A DME Agency:  NA  HH Arranged:  NA HH Agency:  NA  Status of Service:  Completed, signed off  If discussed at Long Length of Stay Meetings, dates discussed:    Additional Comments:  Marcus Mills, Marcus Stoneberg Ellen, RN 10/21/2016, 2:40 PM

## 2016-10-21 NOTE — Progress Notes (Signed)
GI pathogen panel +samonella.  I spoke with Darral DashEsther in Infection Prevention, and she states patient requires Enteric Precautions only if patient is incontinent.  Spoke with Primary Nurse Dawn whom states patient is not incontinent,so isolation is discontinued.  Colman Caterarpley, Aidaly Cordner Danielle

## 2016-10-21 NOTE — Plan of Care (Signed)
Problem: Fluid Volume: Goal: Ability to maintain a balanced intake and output will improve Outcome: Progressing Pt has an improved balanced fluid intake and output.

## 2016-10-24 DIAGNOSIS — E784 Other hyperlipidemia: Secondary | ICD-10-CM | POA: Diagnosis not present

## 2016-10-24 DIAGNOSIS — E878 Other disorders of electrolyte and fluid balance, not elsewhere classified: Secondary | ICD-10-CM | POA: Diagnosis not present

## 2016-10-24 DIAGNOSIS — I2111 ST elevation (STEMI) myocardial infarction involving right coronary artery: Secondary | ICD-10-CM | POA: Diagnosis not present

## 2016-10-24 DIAGNOSIS — R55 Syncope and collapse: Secondary | ICD-10-CM | POA: Diagnosis not present

## 2016-10-27 DIAGNOSIS — D3131 Benign neoplasm of right choroid: Secondary | ICD-10-CM | POA: Diagnosis not present

## 2016-10-27 DIAGNOSIS — H353131 Nonexudative age-related macular degeneration, bilateral, early dry stage: Secondary | ICD-10-CM | POA: Diagnosis not present

## 2016-11-10 ENCOUNTER — Encounter: Payer: Self-pay | Admitting: Cardiology

## 2016-11-10 ENCOUNTER — Ambulatory Visit (INDEPENDENT_AMBULATORY_CARE_PROVIDER_SITE_OTHER): Payer: BLUE CROSS/BLUE SHIELD | Admitting: Cardiology

## 2016-11-10 VITALS — BP 110/78 | HR 61 | Ht 70.0 in | Wt 177.0 lb

## 2016-11-10 DIAGNOSIS — E785 Hyperlipidemia, unspecified: Secondary | ICD-10-CM

## 2016-11-10 DIAGNOSIS — Z79899 Other long term (current) drug therapy: Secondary | ICD-10-CM | POA: Diagnosis not present

## 2016-11-10 DIAGNOSIS — R55 Syncope and collapse: Secondary | ICD-10-CM | POA: Diagnosis not present

## 2016-11-10 DIAGNOSIS — I2119 ST elevation (STEMI) myocardial infarction involving other coronary artery of inferior wall: Secondary | ICD-10-CM

## 2016-11-10 DIAGNOSIS — R001 Bradycardia, unspecified: Secondary | ICD-10-CM | POA: Diagnosis not present

## 2016-11-10 DIAGNOSIS — I2510 Atherosclerotic heart disease of native coronary artery without angina pectoris: Secondary | ICD-10-CM

## 2016-11-10 DIAGNOSIS — R011 Cardiac murmur, unspecified: Secondary | ICD-10-CM | POA: Diagnosis not present

## 2016-11-10 DIAGNOSIS — Z9861 Coronary angioplasty status: Secondary | ICD-10-CM | POA: Diagnosis not present

## 2016-11-10 NOTE — Patient Instructions (Signed)
NO CHANGE WITH MEDICATIONS    LABS  NEED LABS IN DEC 2018- CMP ,LIPIDS DO NOT EAT OR DRINK THE MORNING OF THE TES MAY USE LAB AT OFFICE NO APPOINTMENT IS NEEDED FOR LAB HOURS OF OPERATION 8 AM -4:30 PM WILL MAIL YOU THE LABSLIP    Your physician wants you to follow-up in JAN 2019 WITH DR HARDING. You will receive a reminder letter in the mail two months in advance. If you don't receive a letter, please call our office to schedule the follow-up appointment.   If you need a refill on your cardiac medications before your next appointment, please call your pharmacy.

## 2016-11-10 NOTE — Progress Notes (Signed)
PCP: Alysia Penna, MD  Clinic Note: Chief Complaint  Patient presents with  . Follow-up    post STEMI, post syncope  . Coronary Artery Disease    inferolateral STEMI-PTCA of RPAV (no stent)  . Loss of Consciousness    insetting of Salmonella gastroenteritis with profusewatery diarrhea    HPI: Marcus Mills is a 71 y.o. 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 He had PTCA secondary to tortuous RCA. Unable to place stent.   Marcus Mills was last seen on 09/30/2016 by Mr. Marcus Mills, [place] for post hospital follow-up of his MI. Was doing well at that time, and was only taking half the dose of Avapro. He noted weakness and dizziness with the higher dose.  Recent Hospitalizations:   Admitted August 5-7 with episode of syncope thought to be related to hypotension.he currently had some watery diarrhea and decreased by mouth intake. 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  Studies Personally Reviewed - (if available, images/films reviewed: From Epic Chart or Care Everywhere)  Cardiac Catheterization-PCI 09/22/2016: 100% distal RCA beyond PDA (in RPAV) - PTCA only 2/2 tortuosity (no stent). 50% p-mLAD & distal RCA prior to bifurcation.    2-D Echo 09/22/2016: EF 55-60%. Akinesis of the basal-mid inferolateral wall. GR 2 DD. Mild aortic stenosis. Mild aortic root dilation. Mild MR.  Interval History: Marcus Mills returns today overall doing fairly well. He says he still having a little bit of the diarrhea from his Salmonella infection, but is gradually going back his strength. He is now walking a mile to 2 a day without any symptoms. He Describes feeling lightheaded and dizzy when he first wakes up, but is not having any more episodes of syncope. He did have a little orthostatic dizziness church this  weekend, but had yet to start really eating and drinking properly. He has had 2 episodes of very brief (less than 2 seconds) pinching in his chest that happened at rest and spontaneously resolved. Otherwise no rest or exertional chest tightness or pressure. No exertional dyspnea. He is actually asking about getting back into full activity..  No PND, orthopnea or edema.  No palpitations or recurrent syncope/near syncope. No further episodes of syncope since his ER visit. No TIA/amaurosis fugax symptoms. No melena, hematochezia, hematuria, or epstaxis. No claudication.  ROS: A comprehensive was performed. Review of Systems  Constitutional: Negative for malaise/fatigue (His level is improving, considering that he just had Salmonella.).  Respiratory: Negative for cough, shortness of breath and wheezing.   Gastrointestinal: Positive for diarrhea (just getting over Salmonella - still has some loose stool). Negative for abdominal pain, blood in stool and melena.  Genitourinary: Negative for hematuria.  Musculoskeletal: Negative for joint pain and myalgias.  Neurological: Positive for dizziness (positional ).  Psychiatric/Behavioral: Negative for memory loss. The patient is not nervous/anxious and does not have insomnia (still wakes up several times @ night.).   All other systems reviewed and are negative.   I have reviewed and (if needed) personally updated the patient's problem list, medications, allergies, past medical and surgical history, social and family history.   Past Medical History:  Diagnosis Date  . ALLERGIC RHINITIS 11/04/2006  . CAD S/P percutaneous coronary angioplasty 09/21/2016   PTCA of dRCA/rPAV after rPDA 100% to 30%. Not favorable for Stent 50%  p-mLAD & dRCA.  Marland Kitchen Chronic diastolic CHF (congestive heart failure) (HCC)   . HYPERLIPIDEMIA 11/04/2006  . Mild aortic stenosis by prior echocardiogram   . ST elevation myocardial infarction (STEMI) of inferolateral wall (HCC)  09/22/2016   100% dRCA-RPAV (after RPDA)--> PTCA only    Past Surgical History:  Procedure Laterality Date  . CORONARY BALLOON ANGIOPLASTY N/A 09/22/2016   Procedure: Coronary Balloon Angioplasty;  Surgeon: Lyn Records, MD;  Location: [facility] INVASIVE CV LAB;  Service: Cardiovascular:  dRCA-RPAV after RPDA - PTCA only  . LEFT HEART CATH AND CORONARY ANGIOGRAPHY N/A 09/22/2016   Procedure: Left Heart Cath and Coronary Angiography;  Surgeon: Lyn Records, MD;  Location: [facility] INVASIVE CV LAB;  Service: Cardiovascular: FINDINGS: 100% dRCA beyond PDA (in RPAV) - PTCA only 2/2 tortuosity (no stent). 50% p-mLAD & distal RCA prior to bifurcation.    . TONSILLECTOMY    . TRANSTHORACIC ECHOCARDIOGRAM  09/22/2016   EF 55-60%. Akinesis of the basal-mid inferolateral wall. GR 2 DD. Mild aortic stenosis. Mild aortic root dilation. Mild MR.    Current Meds  Medication Sig  . aspirin 81 MG chewable tablet Chew 1 tablet (81 mg total) by mouth daily. (Patient taking differently: Chew 81 mg by mouth every evening. )  . atorvastatin (LIPITOR) 80 MG tablet Take 1 tablet (80 mg total) by mouth daily at 6 PM.  . clopidogrel (PLAVIX) 75 MG tablet Take 1 tablet (75 mg total) by mouth daily with breakfast.  . Multiple Vitamins-Minerals (ICAPS AREDS 2) CAPS Take 2 capsules by mouth daily.  . nitroGLYCERIN (NITROSTAT) 0.4 MG SL tablet Place 1 tablet (0.4 mg total) under the tongue every 5 (five) minutes as needed. (Patient taking differently: Place 0.4 mg under the tongue every 5 (five) minutes as needed for chest pain. )    No Known Allergies  Social History   Social History  . Marital status: Married    Spouse name: N/A  . Number of children: N/A  . Years of education: N/A   Occupational History  . salesman    Social History Main Topics  . Smoking status: Former Smoker    Quit date: 04/26/1987  . Smokeless tobacco: Never Used  . Alcohol use 0.5 oz/week    1 Standard drinks or equivalent per week  . Drug  use: No  . Sexual activity: Yes   Other Topics Concern  . None   Social History Narrative  . None    family history includes Breast cancer in his sister; Cancer in his father and mother; Hypertension in his mother.  Wt Readings from Last 3 Encounters:  11/10/16 177 lb (80.3 kg)  10/21/16 187 lb 6.4 oz (85 kg)  09/30/16 195 lb 1.9 oz (88.5 kg)    PHYSICAL EXAM BP 110/78 (BP Location: Right Arm, Patient Position: Sitting, Cuff Size: Normal)   Pulse 61   Ht 5\' 10"  (1.778 m)   Wt 177 lb (80.3 kg)   BMI 25.40 kg/m  Physical Exam  Constitutional: He is oriented to person, place, and time. He appears well-developed and well-nourished. No distress.  HENT:  Head: Normocephalic and atraumatic.  Eyes: EOM are normal.  Neck: Normal range of motion. Neck supple. No hepatojugular reflux and no JVD present. Carotid bruit is not present.  Cardiovascular: Normal rate and regular rhythm.   No extrasystoles are present. Exam reveals no gallop and no friction rub.   Murmur heard.  Medium-pitched harsh crescendo-decrescendo early systolic murmur is present with a  grade of 2/6  at the upper right sternal border, upper left sternal border Pulmonary/Chest: Effort normal and breath sounds normal. No respiratory distress. He has no wheezes.  Abdominal: Soft. Bowel sounds are normal. There is no tenderness. There is no rebound.  Musculoskeletal: Normal range of motion. He exhibits no edema.  Neurological: He is alert and oriented to person, place, and time.  Skin: Skin is warm and dry. No rash noted. No erythema. There is pallor.  Psychiatric: He has a normal mood and affect. His behavior is normal. Judgment and thought content normal.  Nursing note and vitals reviewed.    Adult ECG Report  Rate: 61 ;  Rhythm: normal sinus rhythm and normal axis, intervals and durations. Nonspecific ST-T wave abnormalities with T-wave inversions in III, aVF consistent with evolving inferior MI, age undetermined. Q  waves only noted in III and not in aVF.;   Narrative Interpretation: stable EKG with continued progression of inferior MI changes.   Other studies Reviewed: Additional studies/ records that were reviewed today include:  Recent Labs:    Lab Results  Component Value Date   CHOL 184 09/22/2016   HDL 46 09/22/2016   LDLCALC 126 (H) 09/22/2016   LDLDIRECT 204.6 04/25/2010   TRIG 62 09/22/2016   CHOLHDL 4.0 09/22/2016   Lab Results  Component Value Date   CREATININE 0.72 10/21/2016   BUN 5 (L) 10/21/2016   NA 136 10/21/2016   K 3.3 (L) 10/21/2016   CL 105 10/21/2016   CO2 23 10/21/2016   ASSESSMENT / PLAN: Problem List Items Addressed This Visit    Bradycardia, sinus    He is quite bradycardic in the hospital, this may have had something to do with his MI, however he still has a resting heart rate in the 60s which would preclude the use of beta blocker.      CAD S/P percutaneous coronary angioplasty - Primary (Chronic)    Balloon angioplasty of the distal RCA with moderate disease in the LAD as well as the RCA of cream of the occlusion. Dr. Katrinka Blazing was unable to advance a stent, however the patient feels quite well with restoration of flow. He remains on aspirin and Plavix which she should for at least a year. I explained to him that if we make it past the 3 month point, the long-term patency of PTCA site is roughly equivalent to that of a bare-metal stent. He is on rosuvastatin for lipids, but his blood pressure has not tolerated either beta blocker or ARB in the past. As it currently stands I would be reluctant to add any medication especially with her having some orthostatic dizziness..      Relevant Orders   EKG 12-Lead (Completed)   Comprehensive metabolic panel   Hyperlipidemia with target low density lipoprotein (LDL) cholesterol less than 70 mg/dL (Chronic)    His lipids from the time of his MI work interesting in that his drug list rides and total cholesterol as well as HDL  were all pretty good, but is Limited LDL was 126. He is now on high-dose atorvastatin. We should recheck lipids in about 3-4 months which we done before I see him back in follow-up in order to potentially adjust dosing.      Relevant Orders   EKG 12-Lead (Completed)   Lipid panel   Comprehensive metabolic panel   ST elevation myocardial infarction (STEMI) of inferolateral wall (HCC) (Chronic)    Very involved occlusion of the distal RCA beyond the  PDA. He did have akinesis otherwise echo, preserved EF essentially. Woman hope that the wall motion abnormalities would improve now he has been revascularized. Since his EF is still normal, no need to recheck an echo simply for the regional wall motion abnormality. No heart failure symptoms. No recurrent angina.      Syncope (Chronic)    Physical episode was related to hypotension in the setting of profuse watery diarrhea from Salmonella. He does seem to be somewhat sensitive to volume and hypotension, therefore I would avoid ARB or beta blocker at this time.      Relevant Orders   EKG 12-Lead (Completed)   Systolic ejection murmur (Chronic)    Echo showed mild aortic stenosis and mildly dilated ascending aorta. Probably would follow-up and I tell her about 2 years time. This will help both assess the aortic valve/ascending aorta as well as his regional wall motion abnormality.      Relevant Orders   EKG 12-Lead (Completed)    Other Visit Diagnoses    Medication management       Relevant Orders   Lipid panel   Comprehensive metabolic panel      Current medicines are reviewed at length with the patient today. (+/- concerns) None The following changes have been made: None  Patient Instructions  NO CHANGE WITH MEDICATIONS    LABS  NEED LABS IN DEC 2018- CMP ,LIPIDS DO NOT EAT OR DRINK THE MORNING OF THE TES MAY USE LAB AT OFFICE NO APPOINTMENT IS NEEDED FOR LAB HOURS OF OPERATION 8 AM -4:30 PM WILL MAIL YOU THE  LABSLIP    Your physician wants you to follow-up in JAN 2019 WITH DR Ramsey Guadamuz. You will receive a reminder letter in the mail two months in advance. If you don't receive a letter, please call our office to schedule the follow-up appointment.   If you need a refill on your cardiac medications before your next appointment, please call your pharmacy.    Studies Ordered:   Orders Placed This Encounter  Procedures  . Lipid panel  . Comprehensive metabolic panel  . EKG 12-Lead      Bryan Lemma, M.D., M.S. Interventional Cardiologist   Pager # [phone] Phone # [phone] [address] [place], [place] [address]

## 2016-11-11 ENCOUNTER — Encounter: Payer: Self-pay | Admitting: Cardiology

## 2016-11-11 ENCOUNTER — Telehealth: Payer: Self-pay | Admitting: Cardiology

## 2016-11-11 DIAGNOSIS — I2119 ST elevation (STEMI) myocardial infarction involving other coronary artery of inferior wall: Secondary | ICD-10-CM

## 2016-11-11 DIAGNOSIS — Z9861 Coronary angioplasty status: Secondary | ICD-10-CM

## 2016-11-11 DIAGNOSIS — I2510 Atherosclerotic heart disease of native coronary artery without angina pectoris: Secondary | ICD-10-CM

## 2016-11-11 NOTE — Assessment & Plan Note (Signed)
His lipids from the time of his MI work interesting in that his drug list rides and total cholesterol as well as HDL were all pretty good, but is Limited LDL was 126. He is now on high-dose atorvastatin. We should recheck lipids in about 3-4 months which we done before I see him back in follow-up in order to potentially adjust dosing.

## 2016-11-11 NOTE — Assessment & Plan Note (Signed)
Physical episode was related to hypotension in the setting of profuse watery diarrhea from Salmonella. He does seem to be somewhat sensitive to volume and hypotension, therefore I would avoid ARB or beta blocker at this time.

## 2016-11-11 NOTE — Assessment & Plan Note (Signed)
He is quite bradycardic in the hospital, this may have had something to do with his MI, however he still has a resting heart rate in the 60s which would preclude the use of beta blocker.

## 2016-11-11 NOTE — Telephone Encounter (Signed)
New message   Pt wife wants to know what they are doing about cardiac rehab. States that they were supposed to be called with cardiac rehab information in [place] but never got a call.

## 2016-11-11 NOTE — Assessment & Plan Note (Addendum)
Very involved occlusion of the distal RCA beyond the PDA. He did have akinesis otherwise echo, preserved EF essentially. Woman hope that the wall motion abnormalities would improve now he has been revascularized. Since his EF is still normal, no need to recheck an echo simply for the regional wall motion abnormality. No heart failure symptoms. No recurrent angina.

## 2016-11-11 NOTE — Assessment & Plan Note (Signed)
Echo showed mild aortic stenosis and mildly dilated ascending aorta. Probably would follow-up and I tell her about 2 years time. This will help both assess the aortic valve/ascending aorta as well as his regional wall motion abnormality.

## 2016-11-11 NOTE — Assessment & Plan Note (Signed)
Balloon angioplasty of the distal RCA with moderate disease in the LAD as well as the RCA of cream of the occlusion. Dr. Katrinka Blazing was unable to advance a stent, however the patient feels quite well with restoration of flow. He remains on aspirin and Plavix which she should for at least a year. I explained to him that if we make it past the 3 month point, the long-term patency of PTCA site is roughly equivalent to that of a bare-metal stent. He is on rosuvastatin for lipids, but his blood pressure has not tolerated either beta blocker or ARB in the past. As it currently stands I would be reluctant to add any medication especially with her having some orthostatic dizziness.Marland Kitchen

## 2016-11-12 ENCOUNTER — Other Ambulatory Visit: Payer: Self-pay | Admitting: *Deleted

## 2016-11-12 DIAGNOSIS — Z9861 Coronary angioplasty status: Secondary | ICD-10-CM

## 2016-11-12 DIAGNOSIS — I2510 Atherosclerotic heart disease of native coronary artery without angina pectoris: Secondary | ICD-10-CM

## 2016-11-12 DIAGNOSIS — I2119 ST elevation (STEMI) myocardial infarction involving other coronary artery of inferior wall: Secondary | ICD-10-CM

## 2016-11-12 NOTE — Telephone Encounter (Signed)
Just let me know if I need to sign something.  DH

## 2016-11-12 NOTE — Telephone Encounter (Addendum)
SPOKE TO PATIENT'S WIFE , INFORMED HER WILL SEND INFORMATION TO [place] OR [facility]

## 2016-11-12 NOTE — Telephone Encounter (Signed)
ORDER PLACED AND FAXED TO [facility] - WIFE AWARE.

## 2016-12-15 DIAGNOSIS — Z9861 Coronary angioplasty status: Secondary | ICD-10-CM | POA: Diagnosis not present

## 2016-12-15 DIAGNOSIS — I252 Old myocardial infarction: Secondary | ICD-10-CM | POA: Diagnosis not present

## 2016-12-15 DIAGNOSIS — Z48812 Encounter for surgical aftercare following surgery on the circulatory system: Secondary | ICD-10-CM | POA: Diagnosis not present

## 2016-12-15 DIAGNOSIS — I2510 Atherosclerotic heart disease of native coronary artery without angina pectoris: Secondary | ICD-10-CM | POA: Diagnosis not present

## 2016-12-17 DIAGNOSIS — Z9861 Coronary angioplasty status: Secondary | ICD-10-CM | POA: Diagnosis not present

## 2016-12-17 DIAGNOSIS — I2510 Atherosclerotic heart disease of native coronary artery without angina pectoris: Secondary | ICD-10-CM | POA: Diagnosis not present

## 2016-12-17 DIAGNOSIS — I252 Old myocardial infarction: Secondary | ICD-10-CM | POA: Diagnosis not present

## 2016-12-17 DIAGNOSIS — Z48812 Encounter for surgical aftercare following surgery on the circulatory system: Secondary | ICD-10-CM | POA: Diagnosis not present

## 2016-12-18 DIAGNOSIS — I252 Old myocardial infarction: Secondary | ICD-10-CM | POA: Diagnosis not present

## 2016-12-18 DIAGNOSIS — I2510 Atherosclerotic heart disease of native coronary artery without angina pectoris: Secondary | ICD-10-CM | POA: Diagnosis not present

## 2016-12-18 DIAGNOSIS — Z9861 Coronary angioplasty status: Secondary | ICD-10-CM | POA: Diagnosis not present

## 2016-12-18 DIAGNOSIS — Z48812 Encounter for surgical aftercare following surgery on the circulatory system: Secondary | ICD-10-CM | POA: Diagnosis not present

## 2016-12-22 DIAGNOSIS — I252 Old myocardial infarction: Secondary | ICD-10-CM | POA: Diagnosis not present

## 2016-12-22 DIAGNOSIS — I2510 Atherosclerotic heart disease of native coronary artery without angina pectoris: Secondary | ICD-10-CM | POA: Diagnosis not present

## 2016-12-22 DIAGNOSIS — Z48812 Encounter for surgical aftercare following surgery on the circulatory system: Secondary | ICD-10-CM | POA: Diagnosis not present

## 2016-12-22 DIAGNOSIS — Z9861 Coronary angioplasty status: Secondary | ICD-10-CM | POA: Diagnosis not present

## 2016-12-29 DIAGNOSIS — Z48812 Encounter for surgical aftercare following surgery on the circulatory system: Secondary | ICD-10-CM | POA: Diagnosis not present

## 2016-12-29 DIAGNOSIS — Z9861 Coronary angioplasty status: Secondary | ICD-10-CM | POA: Diagnosis not present

## 2016-12-29 DIAGNOSIS — I2510 Atherosclerotic heart disease of native coronary artery without angina pectoris: Secondary | ICD-10-CM | POA: Diagnosis not present

## 2016-12-29 DIAGNOSIS — I252 Old myocardial infarction: Secondary | ICD-10-CM | POA: Diagnosis not present

## 2016-12-31 DIAGNOSIS — Z9861 Coronary angioplasty status: Secondary | ICD-10-CM | POA: Diagnosis not present

## 2016-12-31 DIAGNOSIS — Z48812 Encounter for surgical aftercare following surgery on the circulatory system: Secondary | ICD-10-CM | POA: Diagnosis not present

## 2016-12-31 DIAGNOSIS — I2510 Atherosclerotic heart disease of native coronary artery without angina pectoris: Secondary | ICD-10-CM | POA: Diagnosis not present

## 2016-12-31 DIAGNOSIS — I252 Old myocardial infarction: Secondary | ICD-10-CM | POA: Diagnosis not present

## 2017-01-01 DIAGNOSIS — Z9861 Coronary angioplasty status: Secondary | ICD-10-CM | POA: Diagnosis not present

## 2017-01-01 DIAGNOSIS — I2510 Atherosclerotic heart disease of native coronary artery without angina pectoris: Secondary | ICD-10-CM | POA: Diagnosis not present

## 2017-01-01 DIAGNOSIS — I252 Old myocardial infarction: Secondary | ICD-10-CM | POA: Diagnosis not present

## 2017-01-01 DIAGNOSIS — Z48812 Encounter for surgical aftercare following surgery on the circulatory system: Secondary | ICD-10-CM | POA: Diagnosis not present

## 2017-01-05 DIAGNOSIS — Z48812 Encounter for surgical aftercare following surgery on the circulatory system: Secondary | ICD-10-CM | POA: Diagnosis not present

## 2017-01-05 DIAGNOSIS — I252 Old myocardial infarction: Secondary | ICD-10-CM | POA: Diagnosis not present

## 2017-01-05 DIAGNOSIS — Z9861 Coronary angioplasty status: Secondary | ICD-10-CM | POA: Diagnosis not present

## 2017-01-05 DIAGNOSIS — I2510 Atherosclerotic heart disease of native coronary artery without angina pectoris: Secondary | ICD-10-CM | POA: Diagnosis not present

## 2017-01-12 DIAGNOSIS — I252 Old myocardial infarction: Secondary | ICD-10-CM | POA: Diagnosis not present

## 2017-01-12 DIAGNOSIS — Z9861 Coronary angioplasty status: Secondary | ICD-10-CM | POA: Diagnosis not present

## 2017-01-12 DIAGNOSIS — Z48812 Encounter for surgical aftercare following surgery on the circulatory system: Secondary | ICD-10-CM | POA: Diagnosis not present

## 2017-01-12 DIAGNOSIS — I2510 Atherosclerotic heart disease of native coronary artery without angina pectoris: Secondary | ICD-10-CM | POA: Diagnosis not present

## 2017-01-19 DIAGNOSIS — Z48812 Encounter for surgical aftercare following surgery on the circulatory system: Secondary | ICD-10-CM | POA: Diagnosis not present

## 2017-01-19 DIAGNOSIS — I2510 Atherosclerotic heart disease of native coronary artery without angina pectoris: Secondary | ICD-10-CM | POA: Diagnosis not present

## 2017-01-19 DIAGNOSIS — I252 Old myocardial infarction: Secondary | ICD-10-CM | POA: Diagnosis not present

## 2017-01-19 DIAGNOSIS — Z955 Presence of coronary angioplasty implant and graft: Secondary | ICD-10-CM | POA: Diagnosis not present

## 2017-01-21 DIAGNOSIS — Z955 Presence of coronary angioplasty implant and graft: Secondary | ICD-10-CM | POA: Diagnosis not present

## 2017-01-21 DIAGNOSIS — I2510 Atherosclerotic heart disease of native coronary artery without angina pectoris: Secondary | ICD-10-CM | POA: Diagnosis not present

## 2017-01-21 DIAGNOSIS — I252 Old myocardial infarction: Secondary | ICD-10-CM | POA: Diagnosis not present

## 2017-01-21 DIAGNOSIS — Z48812 Encounter for surgical aftercare following surgery on the circulatory system: Secondary | ICD-10-CM | POA: Diagnosis not present

## 2017-01-22 DIAGNOSIS — Z48812 Encounter for surgical aftercare following surgery on the circulatory system: Secondary | ICD-10-CM | POA: Diagnosis not present

## 2017-01-22 DIAGNOSIS — I2510 Atherosclerotic heart disease of native coronary artery without angina pectoris: Secondary | ICD-10-CM | POA: Diagnosis not present

## 2017-01-22 DIAGNOSIS — Z955 Presence of coronary angioplasty implant and graft: Secondary | ICD-10-CM | POA: Diagnosis not present

## 2017-01-22 DIAGNOSIS — I252 Old myocardial infarction: Secondary | ICD-10-CM | POA: Diagnosis not present

## 2017-01-26 DIAGNOSIS — Z955 Presence of coronary angioplasty implant and graft: Secondary | ICD-10-CM | POA: Diagnosis not present

## 2017-01-26 DIAGNOSIS — I252 Old myocardial infarction: Secondary | ICD-10-CM | POA: Diagnosis not present

## 2017-01-26 DIAGNOSIS — I2510 Atherosclerotic heart disease of native coronary artery without angina pectoris: Secondary | ICD-10-CM | POA: Diagnosis not present

## 2017-01-26 DIAGNOSIS — Z48812 Encounter for surgical aftercare following surgery on the circulatory system: Secondary | ICD-10-CM | POA: Diagnosis not present

## 2017-01-28 DIAGNOSIS — I252 Old myocardial infarction: Secondary | ICD-10-CM | POA: Diagnosis not present

## 2017-01-28 DIAGNOSIS — Z48812 Encounter for surgical aftercare following surgery on the circulatory system: Secondary | ICD-10-CM | POA: Diagnosis not present

## 2017-01-28 DIAGNOSIS — Z955 Presence of coronary angioplasty implant and graft: Secondary | ICD-10-CM | POA: Diagnosis not present

## 2017-01-28 DIAGNOSIS — I2510 Atherosclerotic heart disease of native coronary artery without angina pectoris: Secondary | ICD-10-CM | POA: Diagnosis not present

## 2017-02-03 ENCOUNTER — Telehealth: Payer: Self-pay | Admitting: *Deleted

## 2017-02-03 DIAGNOSIS — Z79899 Other long term (current) drug therapy: Secondary | ICD-10-CM

## 2017-02-03 DIAGNOSIS — I2510 Atherosclerotic heart disease of native coronary artery without angina pectoris: Secondary | ICD-10-CM

## 2017-02-03 DIAGNOSIS — E785 Hyperlipidemia, unspecified: Secondary | ICD-10-CM

## 2017-02-03 DIAGNOSIS — Z9861 Coronary angioplasty status: Secondary | ICD-10-CM

## 2017-02-03 DIAGNOSIS — I252 Old myocardial infarction: Secondary | ICD-10-CM | POA: Diagnosis not present

## 2017-02-03 DIAGNOSIS — Z955 Presence of coronary angioplasty implant and graft: Secondary | ICD-10-CM | POA: Diagnosis not present

## 2017-02-03 DIAGNOSIS — Z48812 Encounter for surgical aftercare following surgery on the circulatory system: Secondary | ICD-10-CM | POA: Diagnosis not present

## 2017-02-03 NOTE — Telephone Encounter (Signed)
LETTER AND LAB SLIP

## 2017-02-03 NOTE — Telephone Encounter (Signed)
-----   Message from Tobin ChadSharon Tayia Stonesifer V, RN sent at 11/10/2016  9:00 AM EDT ----- LAB --CMP,LIPID DUE----Mar 04 2017 WILL MAIL NOV [id number]

## 2017-02-09 DIAGNOSIS — M545 Low back pain: Secondary | ICD-10-CM | POA: Diagnosis not present

## 2017-02-09 DIAGNOSIS — I252 Old myocardial infarction: Secondary | ICD-10-CM | POA: Diagnosis not present

## 2017-02-09 DIAGNOSIS — E7849 Other hyperlipidemia: Secondary | ICD-10-CM | POA: Diagnosis not present

## 2017-02-09 DIAGNOSIS — Z955 Presence of coronary angioplasty implant and graft: Secondary | ICD-10-CM | POA: Diagnosis not present

## 2017-02-09 DIAGNOSIS — I25118 Atherosclerotic heart disease of native coronary artery with other forms of angina pectoris: Secondary | ICD-10-CM | POA: Diagnosis not present

## 2017-02-09 DIAGNOSIS — I2510 Atherosclerotic heart disease of native coronary artery without angina pectoris: Secondary | ICD-10-CM | POA: Diagnosis not present

## 2017-02-09 DIAGNOSIS — Z48812 Encounter for surgical aftercare following surgery on the circulatory system: Secondary | ICD-10-CM | POA: Diagnosis not present

## 2017-02-09 DIAGNOSIS — I493 Ventricular premature depolarization: Secondary | ICD-10-CM | POA: Diagnosis not present

## 2017-02-11 DIAGNOSIS — Z955 Presence of coronary angioplasty implant and graft: Secondary | ICD-10-CM | POA: Diagnosis not present

## 2017-02-11 DIAGNOSIS — I2510 Atherosclerotic heart disease of native coronary artery without angina pectoris: Secondary | ICD-10-CM | POA: Diagnosis not present

## 2017-02-11 DIAGNOSIS — Z48812 Encounter for surgical aftercare following surgery on the circulatory system: Secondary | ICD-10-CM | POA: Diagnosis not present

## 2017-02-11 DIAGNOSIS — I252 Old myocardial infarction: Secondary | ICD-10-CM | POA: Diagnosis not present

## 2017-02-20 DIAGNOSIS — Z9861 Coronary angioplasty status: Secondary | ICD-10-CM | POA: Diagnosis not present

## 2017-02-20 DIAGNOSIS — E785 Hyperlipidemia, unspecified: Secondary | ICD-10-CM | POA: Diagnosis not present

## 2017-02-20 DIAGNOSIS — Z79899 Other long term (current) drug therapy: Secondary | ICD-10-CM | POA: Diagnosis not present

## 2017-02-20 DIAGNOSIS — I2510 Atherosclerotic heart disease of native coronary artery without angina pectoris: Secondary | ICD-10-CM | POA: Diagnosis not present

## 2017-02-21 LAB — COMPREHENSIVE METABOLIC PANEL
ALBUMIN: 4.4 g/dL (ref 3.6–4.8)
ALT: 45 IU/L — ABNORMAL HIGH (ref 0–44)
AST: 32 IU/L (ref 0–40)
Albumin/Globulin Ratio: 2.4 — ABNORMAL HIGH (ref 1.2–2.2)
Alkaline Phosphatase: 81 IU/L (ref 39–117)
BUN / CREAT RATIO: 34 — AB (ref 10–24)
BUN: 24 mg/dL (ref 8–27)
Bilirubin Total: 0.5 mg/dL (ref 0.0–1.2)
CALCIUM: 9.4 mg/dL (ref 8.6–10.2)
CO2: 24 mmol/L (ref 20–29)
CREATININE: 0.7 mg/dL — AB (ref 0.76–1.27)
Chloride: 104 mmol/L (ref 96–106)
GFR, EST AFRICAN AMERICAN: 111 mL/min/{1.73_m2} (ref >59)
GFR, EST NON AFRICAN AMERICAN: 96 mL/min/{1.73_m2} (ref >59)
GLOBULIN, TOTAL: 1.8 g/dL (ref 1.5–4.5)
GLUCOSE: 93 mg/dL (ref 65–99)
Potassium: 4.4 mmol/L (ref 3.5–5.2)
SODIUM: 143 mmol/L (ref 134–144)
TOTAL PROTEIN: 6.2 g/dL (ref 6.0–8.5)

## 2017-02-21 LAB — LIPID PANEL
CHOL/HDL RATIO: 3.2 ratio (ref 0.0–5.0)
CHOLESTEROL TOTAL: 153 mg/dL (ref 100–199)
HDL: 48 mg/dL (ref >39)
LDL CALC: 83 mg/dL (ref 0–99)
Triglycerides: 110 mg/dL (ref 0–149)
VLDL CHOLESTEROL CAL: 22 mg/dL (ref 5–40)

## 2017-02-26 ENCOUNTER — Encounter: Payer: Self-pay | Admitting: Cardiology

## 2017-02-26 ENCOUNTER — Ambulatory Visit (INDEPENDENT_AMBULATORY_CARE_PROVIDER_SITE_OTHER): Payer: BLUE CROSS/BLUE SHIELD | Admitting: Cardiology

## 2017-02-26 VITALS — BP 120/68 | HR 56 | Ht 70.0 in | Wt 160.8 lb

## 2017-02-26 DIAGNOSIS — Z9861 Coronary angioplasty status: Secondary | ICD-10-CM | POA: Diagnosis not present

## 2017-02-26 DIAGNOSIS — I350 Nonrheumatic aortic (valve) stenosis: Secondary | ICD-10-CM | POA: Diagnosis not present

## 2017-02-26 DIAGNOSIS — R001 Bradycardia, unspecified: Secondary | ICD-10-CM

## 2017-02-26 DIAGNOSIS — I2510 Atherosclerotic heart disease of native coronary artery without angina pectoris: Secondary | ICD-10-CM | POA: Diagnosis not present

## 2017-02-26 DIAGNOSIS — R55 Syncope and collapse: Secondary | ICD-10-CM

## 2017-02-26 DIAGNOSIS — Z23 Encounter for immunization: Secondary | ICD-10-CM

## 2017-02-26 DIAGNOSIS — E785 Hyperlipidemia, unspecified: Secondary | ICD-10-CM

## 2017-02-26 NOTE — Progress Notes (Signed)
PCP: Alysia Penna, MD  Clinic Note: Chief Complaint  Patient presents with  . Follow-up  . Coronary Artery Disease    HPI: Marcus Mills is a 71 y.o. 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 He had PTCA secondary to tortuous RCA. Unable to place stent.   Marcus Mills was last seen on 09/30/2016 by Mr. Marcus Mills, [place] for post hospital follow-up of his MI. Was doing well at that time, and was only taking half the dose of Avapro. He noted weakness and dizziness with the higher dose.  Recent Hospitalizations:   Admitted August 5-7 with episode of syncope thought to be related to hypotension.he currently had some watery diarrhea and decreased by mouth intake. 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  Studies Personally Reviewed - (if available, images/films reviewed: From Epic Chart or Care Everywhere)  Cardiac Catheterization-PCI 09/22/2016: 100% distal RCA beyond PDA (in RPAV) - PTCA only 2/2 tortuosity (no stent). 50% p-mLAD & distal RCA prior to bifurcation.    2-D Echo 09/22/2016: EF 55-60%. Akinesis of the basal-mid inferolateral wall. GR 2 DD. Mild aortic stenosis. Mild aortic root dilation. Mild MR.  Interval History: Marcus Mills returns today overall doing fairly well.  He notes that he is recovered from his Salmonella colitis, and is back to his routine level of exercise.  In doing these levels of activity, he denies any chest tightness or pressure with rest or exertion.  He notes occasional orthostatic dizziness as well as rare palpitations.  He apparently was told during his rehab that he was having PVCs, until he had been and was told that they were he had no idea what they were.  He has not really had much of that symptom. No further anginal chest discomfort with rest or exertion.  No PND,  orthopnea or edema. Remainder of cardiac review of symptoms: No palpitations, lightheadedness, dizziness, weakness or syncope/near syncope. No TIA/amaurosis fugax symptoms. No claudication.  ROS: A comprehensive was performed. Review of Systems  Constitutional: Negative for weight loss. Malaise/fatigue: His level is improving, considering that he just had Salmonella.  HENT: Negative for nosebleeds.   Respiratory: Negative for cough, shortness of breath and wheezing.   Gastrointestinal: Negative for abdominal pain, blood in stool, diarrhea and melena.  Genitourinary: Negative for hematuria.  Musculoskeletal: Negative for joint pain and myalgias.  Neurological: Positive for dizziness (positional ).  Psychiatric/Behavioral: Negative for memory loss. The patient is not nervous/anxious and does not have insomnia (still wakes up several times @ night.).   All other systems reviewed and are negative.   I have reviewed and (if needed) personally updated the patient's problem list, medications, allergies, past medical and surgical history, social and family history.   Past Medical History:  Diagnosis Date  . ALLERGIC RHINITIS 11/04/2006  . CAD S/P percutaneous coronary angioplasty 09/21/2016   PTCA of dRCA/rPAV after rPDA 100% to 30%. Not favorable for Stent 50% p-mLAD & dRCA.  Marland Kitchen Chronic diastolic CHF (congestive heart failure) (HCC)   . HYPERLIPIDEMIA 11/04/2006  . Mild aortic stenosis by prior echocardiogram   . ST elevation myocardial infarction (STEMI) of inferolateral wall (HCC) 09/22/2016   100% dRCA-RPAV (after RPDA)--> PTCA only    Past Surgical History:  Procedure Laterality Date  . CORONARY BALLOON ANGIOPLASTY N/A 09/22/2016  Procedure: Coronary Balloon Angioplasty;  Surgeon: Lyn Records, MD;  Location: [facility] INVASIVE CV LAB;  Service: Cardiovascular:  dRCA-RPAV after RPDA - PTCA only  . LEFT HEART CATH AND CORONARY ANGIOGRAPHY N/A 09/22/2016   Procedure: Left Heart Cath and Coronary  Angiography;  Surgeon: Lyn Records, MD;  Location: [facility] INVASIVE CV LAB;  Service: Cardiovascular: FINDINGS: 100% dRCA beyond PDA (in RPAV) - PTCA only 2/2 tortuosity (no stent). 50% p-mLAD & distal RCA prior to bifurcation.    . TONSILLECTOMY    . TRANSTHORACIC ECHOCARDIOGRAM  09/22/2016   EF 55-60%. Akinesis of the basal-mid inferolateral wall. GR 2 DD. Mild aortic stenosis. Mild aortic root dilation. Mild MR.    Current Meds  Medication Sig  . aspirin 81 MG chewable tablet Chew 1 tablet (81 mg total) by mouth daily. (Patient taking differently: Chew 81 mg by mouth every evening. )  . atorvastatin (LIPITOR) 80 MG tablet Take 1 tablet (80 mg total) by mouth daily at 6 PM.  . clopidogrel (PLAVIX) 75 MG tablet Take 1 tablet (75 mg total) by mouth daily with breakfast.  . Coenzyme Q10 (CO Q 10 PO) Take 1 tablet by mouth daily.  . Ergocalciferol (VITAMIN D2 PO) Take 1 tablet by mouth daily.  . Multiple Vitamins-Minerals (ICAPS AREDS 2) CAPS Take 2 capsules by mouth daily.  . nitroGLYCERIN (NITROSTAT) 0.4 MG SL tablet Place 1 tablet (0.4 mg total) under the tongue every 5 (five) minutes as needed. (Patient taking differently: Place 0.4 mg under the tongue every 5 (five) minutes as needed for chest pain. )    No Known Allergies  Social History   Socioeconomic History  . Marital status: Married    Spouse name: None  . Number of children: None  . Years of education: None  . Highest education level: None  Social Needs  . Financial resource strain: None  . Food insecurity - worry: None  . Food insecurity - inability: None  . Transportation needs - medical: None  . Transportation needs - non-medical: None  Occupational History  . Occupation: salesman  Tobacco Use  . Smoking status: Former Smoker    Last attempt to quit: 04/26/1987    Years since quitting: 29.8  . Smokeless tobacco: Never Used  Substance and Sexual Activity  . Alcohol use: Yes    Alcohol/week: 0.5 oz    Types: 1  Standard drinks or equivalent per week  . Drug use: No  . Sexual activity: Yes  Other Topics Concern  . None  Social History Narrative  . None    family history includes Breast cancer in his sister; Cancer in his father and mother; Hypertension in his mother.  Wt Readings from Last 3 Encounters:  02/26/17 160 lb 12.8 oz (72.9 kg)  11/10/16 177 lb (80.3 kg)  10/21/16 187 lb 6.4 oz (85 kg)    PHYSICAL EXAM BP 120/68 (BP Location: Left Arm, Patient Position: Sitting, Cuff Size: Normal)   Pulse (!) 56   Ht 5\' 10"  (1.778 m)   Wt 160 lb 12.8 oz (72.9 kg)   BMI 23.07 kg/m  Physical Exam  Constitutional: He is oriented to person, place, and time. He appears well-developed and well-nourished. No distress.  HENT:  Head: Normocephalic and atraumatic.  Eyes: EOM are normal.  Neck: Normal range of motion. Neck supple. No hepatojugular reflux and no JVD present. Carotid bruit is not present.  Cardiovascular: Normal rate and regular rhythm.  No extrasystoles are present. Exam reveals no  gallop and no friction rub.  Murmur heard.  Medium-pitched harsh crescendo-decrescendo early systolic murmur is present with a grade of 2/6 at the upper right sternal border and upper left sternal border. Pulmonary/Chest: Effort normal and breath sounds normal. No respiratory distress. He has no wheezes.  Abdominal: Soft. Bowel sounds are normal. There is no tenderness. There is no rebound.  Musculoskeletal: Normal range of motion. He exhibits no edema.  Neurological: He is alert and oriented to person, place, and time.  Skin: Skin is warm and dry. No rash noted. No erythema. There is pallor.  Psychiatric: He has a normal mood and affect. His behavior is normal. Judgment and thought content normal.  Nursing note and vitals reviewed.    Adult ECG Report  Rate: 61 ;  Rhythm: normal sinus rhythm and normal axis, intervals and durations. Nonspecific ST-T wave abnormalities with T-wave inversions in III, aVF  consistent with evolving inferior MI, age undetermined. Q waves only noted in III and not in aVF.;   Narrative Interpretation: stable EKG with continued progression of inferior MI changes.   Other studies Reviewed: Additional studies/ records that were reviewed today include:  Recent Labs:    Lab Results  Component Value Date   CHOL 153 02/20/2017   HDL 48 02/20/2017   LDLCALC 83 02/20/2017   LDLDIRECT 204.6 04/25/2010   TRIG 110 02/20/2017   CHOLHDL 3.2 02/20/2017   Lab Results  Component Value Date   CREATININE 0.70 (L) 02/20/2017   BUN 24 02/20/2017   NA 143 02/20/2017   K 4.4 02/20/2017   CL 104 02/20/2017   CO2 24 02/20/2017   ASSESSMENT / PLAN: Problem List Items Addressed This Visit    Bradycardia, sinus    Resting sinus bradycardia is probably related to his conditioning and perhaps some baseline conduction abnormalities, however he remains asymptomatic and does not seem to have signs of chronotropic incompetence. Resting heart rate of 56 precludes use of beta-blocker.      CAD S/P percutaneous coronary angioplasty - Primary (Chronic)    Doing quite well with a favorable outcome after balloon angioplasty alone of the distal RCA.  Moderate disease elsewhere.  For now we will continue aspirin plus Plavix, he would be okay potentially holding aspirin if necessary for bruising. Continue statin He does not have any significant blood pressure issues and has done well without any beta-blocker or ARB.  Since he is having some orthostatic dizziness without any medications, I would be reluctant to add any antihypertensive agents.      Relevant Orders   Comprehensive metabolic panel   Hyperlipidemia with target low density lipoprotein (LDL) cholesterol less than 70 mg/dL (Chronic)    He is on high-dose atorvastatin, and we will reassess his lipids in February time frame and adjust accordingly.  I would like to go reduced from 80-40 mg.  He will continue co-Q10.  LDL is  currently 83 but this is a notable drop from 126.  I hope that we can reduce his atorvastatin dose, even if it means using the addition of Zetia.  I fear for using high-dose statin for long period time leading to side effects.      Relevant Orders   Lipid panel   Comprehensive metabolic panel   Mild aortic stenosis by prior echocardiogram (Chronic)    Mild AS noted on exam and on echo.  Follow-up echo in roughly 2 years      Syncope (Chronic)    No further episodes.  This  was in association with the Salmonella colitis and volume depletion/hypotension. However since he still has somewhat static dizziness, we are avoiding antihypertensive agents.       Other Visit Diagnoses    Need for immunization against influenza       Relevant Orders   Flu Vaccine QUAD 36+ mos IM (Completed)      Current medicines are reviewed at length with the patient today. (+/- concerns) None The following changes have been made: None  Patient Instructions  Medication   No changes at present time.    Labs in FEB 2019 LIPIDS CMP DO NOT EAT OR DRINK THE MORNING OF THE TEST.  Recommend for dental work - if  plavix is not needed for interruption may do dental procedure if need to hold plavix for several days will prefer to wait for at least 6 months.   Your physician recommends that you [facility]ARCH 2019 [facility]    If you need a refill on your cardiac medications before your next appointment, please call your pharmacy.     Studies Ordered:   Orders Placed This Encounter  Procedures  . Flu Vaccine QUAD 36+ mos IM  . Lipid panel  . Comprehensive metabolic panel      Bryan Lemmaavid Harding, M.D., M.S. Interventional Cardiologist   Pager # [phone] Phone # [phone] [address] [place], [place] [id number]

## 2017-02-26 NOTE — Patient Instructions (Addendum)
Medication   No changes at present time.    Labs in FEB 2019 LIPIDS CMP DO NOT EAT OR DRINK THE MORNING OF THE TEST.  Recommend for dental work - if  plavix is not needed for interruption may do dental procedure if need to hold plavix for several days will prefer to wait for at least 6 months.   Your physician recommends that you [facility] a [facility]    If you need a refill on your cardiac medications before your next appointment, please call your pharmacy.

## 2017-02-28 NOTE — Assessment & Plan Note (Signed)
He is on high-dose atorvastatin, and we will reassess his lipids in February time frame and adjust accordingly.  I would like to go reduced from 80-40 mg.  He will continue co-Q10.  LDL is currently 83 but this is a notable drop from 126.  I hope that we can reduce his atorvastatin dose, even if it means using the addition of Zetia.  I fear for using high-dose statin for long period time leading to side effects.

## 2017-02-28 NOTE — Assessment & Plan Note (Addendum)
Doing quite well with a favorable outcome after balloon angioplasty alone of the distal RCA.  Moderate disease elsewhere.  For now we will continue aspirin plus Plavix, he would be okay potentially holding aspirin if necessary for bruising. Continue statin He does not have any significant blood pressure issues and has done well without any beta-blocker or ARB.  Since he is having some orthostatic dizziness without any medications, I would be reluctant to add any antihypertensive agents.

## 2017-02-28 NOTE — Assessment & Plan Note (Signed)
Resting sinus bradycardia is probably related to his conditioning and perhaps some baseline conduction abnormalities, however he remains asymptomatic and does not seem to have signs of chronotropic incompetence. Resting heart rate of 56 precludes use of beta-blocker.

## 2017-02-28 NOTE — Assessment & Plan Note (Signed)
Mild AS noted on exam and on echo.  Follow-up echo in roughly 2 years

## 2017-02-28 NOTE — Assessment & Plan Note (Signed)
No further episodes.  This was in association with the Salmonella colitis and volume depletion/hypotension. However since he still has somewhat static dizziness, we are avoiding antihypertensive agents.

## 2017-03-02 DIAGNOSIS — Z48812 Encounter for surgical aftercare following surgery on the circulatory system: Secondary | ICD-10-CM | POA: Diagnosis not present

## 2017-03-02 DIAGNOSIS — I252 Old myocardial infarction: Secondary | ICD-10-CM | POA: Diagnosis not present

## 2017-03-02 DIAGNOSIS — I2510 Atherosclerotic heart disease of native coronary artery without angina pectoris: Secondary | ICD-10-CM | POA: Diagnosis not present

## 2017-03-04 DIAGNOSIS — I252 Old myocardial infarction: Secondary | ICD-10-CM | POA: Diagnosis not present

## 2017-03-04 DIAGNOSIS — I2510 Atherosclerotic heart disease of native coronary artery without angina pectoris: Secondary | ICD-10-CM | POA: Diagnosis not present

## 2017-03-04 DIAGNOSIS — Z48812 Encounter for surgical aftercare following surgery on the circulatory system: Secondary | ICD-10-CM | POA: Diagnosis not present

## 2017-03-05 DIAGNOSIS — I252 Old myocardial infarction: Secondary | ICD-10-CM | POA: Diagnosis not present

## 2017-03-05 DIAGNOSIS — I2510 Atherosclerotic heart disease of native coronary artery without angina pectoris: Secondary | ICD-10-CM | POA: Diagnosis not present

## 2017-03-05 DIAGNOSIS — Z48812 Encounter for surgical aftercare following surgery on the circulatory system: Secondary | ICD-10-CM | POA: Diagnosis not present

## 2017-03-11 DIAGNOSIS — I252 Old myocardial infarction: Secondary | ICD-10-CM | POA: Diagnosis not present

## 2017-03-11 DIAGNOSIS — Z48812 Encounter for surgical aftercare following surgery on the circulatory system: Secondary | ICD-10-CM | POA: Diagnosis not present

## 2017-03-11 DIAGNOSIS — I2510 Atherosclerotic heart disease of native coronary artery without angina pectoris: Secondary | ICD-10-CM | POA: Diagnosis not present

## 2017-03-23 ENCOUNTER — Ambulatory Visit: Payer: BLUE CROSS/BLUE SHIELD | Admitting: Cardiology

## 2017-03-25 ENCOUNTER — Telehealth: Payer: Self-pay | Admitting: Cardiology

## 2017-03-25 MED ORDER — ATORVASTATIN CALCIUM 80 MG PO TABS: 80.0000 mg | ORAL_TABLET | Freq: Every day | ORAL | 0 refills | Status: DC

## 2017-03-25 NOTE — Telephone Encounter (Signed)
°*  STAT* If patient is at the pharmacy, call can be transferred to refill team.   1. Which medications need to be refilled? (please list name of each medication and dose if known)   atorvastatin (LIPITOR) 80 MG tablet     atorvastatin (LIPITOR) 80 MG tablet    2. Which pharmacy/location (including street and city if local pharmacy) is medication to be sent to? [facility] [id number] in [place]  3. Do they need a 30 day or 90 day supply?  90

## 2017-03-26 ENCOUNTER — Telehealth: Payer: Self-pay | Admitting: Cardiology

## 2017-03-26 MED ORDER — CLOPIDOGREL BISULFATE 75 MG PO TABS: 75.0000 mg | ORAL_TABLET | Freq: Every day | ORAL | 1 refills | Status: DC

## 2017-03-26 NOTE — Telephone Encounter (Signed)
New Message   *STAT* If patient is at the pharmacy, call can be transferred to refill team.   1. Which medications need to be refilled? (please list name of each medication and dose if known) clopidogrel 75 MG tablet  2. Which pharmacy/location (including street and city if local pharmacy) is medication to be sent to? [facility] [place]   3. Do they need a 30 day or 90 day supply? 90 day supply  Patient is leaving out of town in the next 1hr and 30 minutes. He thought he called this in yesterday but did not.

## 2017-05-01 DIAGNOSIS — Z7901 Long term (current) use of anticoagulants: Secondary | ICD-10-CM | POA: Diagnosis not present

## 2017-05-01 DIAGNOSIS — I2510 Atherosclerotic heart disease of native coronary artery without angina pectoris: Secondary | ICD-10-CM | POA: Diagnosis not present

## 2017-05-01 DIAGNOSIS — R5383 Other fatigue: Secondary | ICD-10-CM | POA: Diagnosis not present

## 2017-05-01 DIAGNOSIS — R197 Diarrhea, unspecified: Secondary | ICD-10-CM | POA: Diagnosis not present

## 2017-05-03 DIAGNOSIS — R197 Diarrhea, unspecified: Secondary | ICD-10-CM | POA: Diagnosis not present

## 2017-05-15 DIAGNOSIS — I2510 Atherosclerotic heart disease of native coronary artery without angina pectoris: Secondary | ICD-10-CM | POA: Diagnosis not present

## 2017-05-15 DIAGNOSIS — Z9861 Coronary angioplasty status: Secondary | ICD-10-CM | POA: Diagnosis not present

## 2017-05-15 DIAGNOSIS — E785 Hyperlipidemia, unspecified: Secondary | ICD-10-CM | POA: Diagnosis not present

## 2017-05-15 LAB — COMPREHENSIVE METABOLIC PANEL
A/G RATIO: 2.1 (ref 1.2–2.2)
ALT: 57 IU/L — AB (ref 0–44)
AST: 36 IU/L (ref 0–40)
Albumin: 4.1 g/dL (ref 3.6–4.8)
Alkaline Phosphatase: 72 IU/L (ref 39–117)
BILIRUBIN TOTAL: 0.6 mg/dL (ref 0.0–1.2)
BUN/Creatinine Ratio: 28 — ABNORMAL HIGH (ref 10–24)
BUN: 20 mg/dL (ref 8–27)
CALCIUM: 9.3 mg/dL (ref 8.6–10.2)
CHLORIDE: 103 mmol/L (ref 96–106)
CO2: 26 mmol/L (ref 20–29)
Creatinine, Ser: 0.71 mg/dL — ABNORMAL LOW (ref 0.76–1.27)
GFR, EST AFRICAN AMERICAN: 111 mL/min/{1.73_m2} (ref >59)
GFR, EST NON AFRICAN AMERICAN: 96 mL/min/{1.73_m2} (ref >59)
GLOBULIN, TOTAL: 2 g/dL (ref 1.5–4.5)
Glucose: 86 mg/dL (ref 65–99)
POTASSIUM: 5 mmol/L (ref 3.5–5.2)
SODIUM: 142 mmol/L (ref 134–144)
TOTAL PROTEIN: 6.1 g/dL (ref 6.0–8.5)

## 2017-05-15 LAB — LIPID PANEL
CHOL/HDL RATIO: 3.2 ratio (ref 0.0–5.0)
CHOLESTEROL TOTAL: 144 mg/dL (ref 100–199)
HDL: 45 mg/dL (ref >39)
LDL CALC: 77 mg/dL (ref 0–99)
TRIGLYCERIDES: 109 mg/dL (ref 0–149)
VLDL Cholesterol Cal: 22 mg/dL (ref 5–40)

## 2017-05-18 ENCOUNTER — Other Ambulatory Visit: Payer: Self-pay | Admitting: *Deleted

## 2017-05-18 MED ORDER — ATORVASTATIN CALCIUM 80 MG PO TABS
80.0000 mg | ORAL_TABLET | Freq: Every day | ORAL | 0 refills | Status: DC
Start: 2017-05-18 — End: 2017-05-25

## 2017-05-25 ENCOUNTER — Other Ambulatory Visit: Payer: Self-pay | Admitting: *Deleted

## 2017-05-25 MED ORDER — ATORVASTATIN CALCIUM 80 MG PO TABS
80.0000 mg | ORAL_TABLET | Freq: Every day | ORAL | 1 refills | Status: DC
Start: 2017-05-25 — End: 2017-05-29

## 2017-05-25 NOTE — Telephone Encounter (Signed)
Rx has been sent to the pharmacy electronically. ° °

## 2017-05-29 ENCOUNTER — Ambulatory Visit: Payer: BLUE CROSS/BLUE SHIELD | Admitting: Cardiology

## 2017-05-29 ENCOUNTER — Encounter: Payer: Self-pay | Admitting: Cardiology

## 2017-05-29 VITALS — BP 115/68 | HR 51 | Ht 70.0 in | Wt 156.2 lb

## 2017-05-29 DIAGNOSIS — I2119 ST elevation (STEMI) myocardial infarction involving other coronary artery of inferior wall: Secondary | ICD-10-CM | POA: Diagnosis not present

## 2017-05-29 DIAGNOSIS — R001 Bradycardia, unspecified: Secondary | ICD-10-CM

## 2017-05-29 DIAGNOSIS — Z9861 Coronary angioplasty status: Secondary | ICD-10-CM

## 2017-05-29 DIAGNOSIS — E785 Hyperlipidemia, unspecified: Secondary | ICD-10-CM

## 2017-05-29 DIAGNOSIS — I350 Nonrheumatic aortic (valve) stenosis: Secondary | ICD-10-CM | POA: Diagnosis not present

## 2017-05-29 DIAGNOSIS — I2510 Atherosclerotic heart disease of native coronary artery without angina pectoris: Secondary | ICD-10-CM | POA: Diagnosis not present

## 2017-05-29 MED ORDER — ROSUVASTATIN CALCIUM 40 MG PO TABS: 40.0000 mg | ORAL_TABLET | Freq: Every day | ORAL | 3 refills | Status: DC

## 2017-05-29 MED ORDER — EZETIMIBE 10 MG PO TABS: 10.0000 mg | ORAL_TABLET | Freq: Every day | ORAL | 3 refills | Status: DC

## 2017-05-29 NOTE — Progress Notes (Signed)
PCP: Alysia Penna, MD  Clinic Note: Chief Complaint  Patient presents with  . Follow-up    No active symptoms  . Coronary Artery Disease    HPI: Marcus Mills is a 72 y.o. 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 He had PTCA secondary to tortuous RCA. Unable to place stent.  Marcus Mills was last seen on in December 2018. Was doing well at that time --but had a bout of salmonella  colitis.  Was only taking half the dose of Avapro. He noted weakness and dizziness with the higher dose.  Recent Hospitalizations:   none  Studies Personally Reviewed - (if available, images/films reviewed: From Epic Chart or Care Everywhere)  N/a  Interval History: Marcus Mills returns today overall doing fairly well.  He has completed his cardiac, and is now currently doing the maintenance program.  He is doing quite well without any recurrence of any anginal symptoms.  Rehab in [place] no chest tightness or pressure with rest or exertion.  No exertional dyspnea.  No longer noting any orthostatic symptoms,  Lightheadedness, dizziness or wooziness.  No syncope/near syncope or TIA/emesis fugax. No sensation of significant rapid irregular heartbeats or palpitations.  Intermittently having some skipped beats, but that was only because he was told that he had PVCs while being monitored at cardiac rehab.  He really does not notice them. No PND, orthopnea or edema.  No bleeding, bruising or melena, hematochezia, hematuria, epistaxis.  No claudication  ROS: A comprehensive was performed. Review of Systems  Constitutional: Negative for malaise/fatigue (Much improved ) and weight loss (He took advantage of his weight loss in the hospital, and is continuing to try to work on weight loss with diet and exercise.).  HENT: Negative for congestion  and nosebleeds.   Respiratory: Negative for cough, shortness of breath and wheezing.   Gastrointestinal: Negative for abdominal pain, blood in stool, diarrhea and melena.  Genitourinary: Negative for hematuria.  Musculoskeletal: Negative for joint pain and myalgias.  Neurological: Negative for dizziness and weakness.  Endo/Heme/Allergies: Negative for environmental allergies. Does not bruise/bleed easily.  Psychiatric/Behavioral: Negative for memory loss. The patient is not nervous/anxious and does not have insomnia (still wakes up several times @ night.).   All other systems reviewed and are negative.   I have reviewed and (if needed) personally updated the patient's problem list, medications, allergies, past medical and surgical history, social and family history.   Past Medical History:  Diagnosis Date  . ALLERGIC RHINITIS 11/04/2006  . CAD S/P percutaneous coronary angioplasty 09/21/2016   PTCA of dRCA/rPAV after rPDA 100% to 30%. Not favorable for Stent 50% p-mLAD & dRCA.  Marland Kitchen Chronic diastolic CHF (congestive heart failure) (HCC)   . HYPERLIPIDEMIA 11/04/2006  . Mild aortic stenosis by prior echocardiogram   . ST elevation myocardial infarction (STEMI) of inferolateral wall (HCC) 09/22/2016   100% dRCA-RPAV (after RPDA)--> PTCA only    Past Surgical History:  Procedure Laterality Date  . CORONARY BALLOON ANGIOPLASTY N/A 09/22/2016   Procedure: Coronary Balloon Angioplasty;  Surgeon: Lyn Records, MD;  Location: [facility] INVASIVE CV LAB;  Service: Cardiovascular:  dRCA-RPAV after RPDA - PTCA only  . LEFT HEART CATH AND CORONARY ANGIOGRAPHY N/A 09/22/2016   Procedure: Left Heart Cath and Coronary Angiography;  Surgeon: Lyn Records, MD;  Location: [facility] INVASIVE CV LAB;  Service: Cardiovascular: FINDINGS: 100% dRCA beyond PDA (in RPAV) - PTCA only 2/2 tortuosity (no stent). 50% p-mLAD & distal RCA prior to bifurcation.    . TONSILLECTOMY    . TRANSTHORACIC ECHOCARDIOGRAM  09/22/2016   EF  55-60%. Akinesis of the basal-mid inferolateral wall. GR 2 DD. Mild aortic stenosis. Mild aortic root dilation. Mild MR.    Current Meds  Medication Sig  . aspirin 81 MG chewable tablet Chew 1 tablet (81 mg total) by mouth daily. (Patient taking differently: Chew 81 mg by mouth every evening. )  . clopidogrel (PLAVIX) 75 MG tablet Take 1 tablet (75 mg total) by mouth daily with breakfast.  . Coenzyme Q10 (CO Q 10 PO) Take 1 tablet by mouth daily.  . Ergocalciferol (VITAMIN D2 PO) Take 1 tablet by mouth daily.  . Multiple Vitamins-Minerals (ICAPS AREDS 2) CAPS Take 2 capsules by mouth daily.  . nitroGLYCERIN (NITROSTAT) 0.4 MG SL tablet Place 1 tablet (0.4 mg total) under the tongue every 5 (five) minutes as needed. (Patient taking differently: Place 0.4 mg under the tongue every 5 (five) minutes as needed for chest pain. )  . [DISCONTINUED] atorvastatin (LIPITOR) 80 MG tablet Take 1 tablet (80 mg total) by mouth daily at 6 PM.    No Known Allergies  Social History   Tobacco Use  . Smoking status: Former Smoker    Last attempt to quit: 04/26/1987    Years since quitting: 30.1  . Smokeless tobacco: Never Used  Substance Use Topics  . Alcohol use: Yes    Alcohol/week: 0.5 oz    Types: 1 Standard drinks or equivalent per week  . Drug use: No   Social History   Social History Narrative  . Not on file     family history includes Breast cancer in his sister; Cancer in his father and mother; Hypertension in his mother.  Wt Readings from Last 3 Encounters:  05/29/17 156 lb 3.2 oz (70.9 kg)  02/26/17 160 lb 12.8 oz (72.9 kg)  11/10/16 177 lb (80.3 kg)  -intentional    PHYSICAL EXAM BP 115/68   Pulse (!) 51   Ht 5\' 10"  (1.778 m)   Wt 156 lb 3.2 oz (70.9 kg)   BMI 22.41 kg/m  Physical Exam  Constitutional: He is oriented to person, place, and time. He appears well-developed and well-nourished. No distress.  HENT:  Head: Normocephalic and atraumatic.  Neck: Normal range of  motion. Neck supple. No hepatojugular reflux and no JVD present. Carotid bruit is not present.  Cardiovascular: Normal rate and regular rhythm.  No extrasystoles are present. Exam reveals no gallop and no friction rub.  Murmur heard.  Medium-pitched harsh crescendo-decrescendo early systolic murmur is present with a grade of 2/6 at the upper right sternal border and upper left sternal border. Pulmonary/Chest: Effort normal and breath sounds normal. No respiratory distress. He has no wheezes.  Abdominal: Soft. Bowel sounds are normal. There is no tenderness. There is no rebound.  Musculoskeletal: Normal range of motion. He exhibits no edema.  Neurological: He is alert and oriented to person, place, and time.  Skin: Skin is warm and dry. No rash noted. No erythema.  Psychiatric: He has a normal mood and affect. His behavior is normal. Judgment and thought content normal.  Nursing note and vitals reviewed.    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  Unable to advance stent.  No signs or symptoms of recurrent angina or heart failure.  He did have some akinesis in the inferolateral wall, but preserved EF.  No signs of heart failure.  At this time no will need to reassess.      Relevant Medications   ezetimibe (ZETIA) 10 MG tablet   rosuvastatin (CRESTOR) 40 MG  tablet   Mild aortic stenosis by prior echocardiogram (Chronic)    Follow-up echo in roughly July 2020      Relevant Medications   ezetimibe (ZETIA) 10 MG tablet   rosuvastatin (CRESTOR) 40 MG tablet   Hyperlipidemia with target low density lipoprotein (LDL) cholesterol less than 70 mg/dL (Chronic)    Despite attempts at weight loss and diet change, he continues to only have LDL reduction to 77.  We talked about goal of getting closer to 50.  His wife is currently taking rosuvastatin, and he is interested in converting from atorvastatin to rosuvastatin which would allow lower dose and therefore less side effect and long-term management.  We will also add Zetia and then reassess in roughly 6 months.      Relevant Medications   ezetimibe (ZETIA) 10 MG tablet   rosuvastatin (CRESTOR) 40 MG tablet   CAD S/P percutaneous coronary angioplasty - Primary (Chronic)    Doing relatively well post angioplasty distal RCA.  At this point, he is 6+ months out from his angioplasty with no signs of recoil.  At this point, his likelihood of restenosis is similar to that of a bare-metal stent.   Continue aspirin Plavix for now.  Would likely continue Plavix for at least another year and stop aspirin. Continue statin, however will convert to total rosuvastatin. With borderline blood pressures and heart rate, and unable to use beta-blocker or ACE inhibitor/ARB.      Relevant Medications   ezetimibe (ZETIA) 10 MG tablet   rosuvastatin (CRESTOR) 40 MG tablet   Bradycardia, sinus (Chronic)    Stable,Astigmatic resting bradycardia.  No signs of chronotropic incompetence. Unable to use beta-blocker.      Relevant Medications   ezetimibe (ZETIA) 10 MG tablet   rosuvastatin (CRESTOR) 40 MG tablet      Current medicines are reviewed at length with the patient today. (+/- concerns) None The following changes have been made: None  Patient Instructions  Medication Instructions: STOP the  Atorvastatin START Zetia 10 mg daily START Rosuvastatin (crestor) 40 mg tablet daily  If you need a refill on your cardiac medications before your next appointment, please call your pharmacy.    Follow-Up: Your physician wants you to follow-up in August with Dr. Herbie BaltimoreHarding. You will receive a reminder letter in the mail two months in advance. If you don't receive a letter, please call our office at [phone] to schedule this follow-up appointment.    Thank you for choosing [facility] at  [facility]!!        Studies Ordered:   No orders of the defined types were placed in this encounter.     Bryan Lemma, M.D., M.S. Interventional Cardiologist   Pager # [phone] Phone # [phone] [address], [place] [address]

## 2017-05-29 NOTE — Patient Instructions (Signed)
Medication Instructions: STOP the Atorvastatin START Zetia 10 mg daily START Rosuvastatin (crestor) 40 mg tablet daily  If you need a refill on your cardiac medications before your next appointment, please call your pharmacy.    Follow-Up: Your physician wants you to follow-up in August with Dr. Herbie BaltimoreHarding. You will receive a reminder letter in the mail two months in advance. If you don't receive a letter, please call our office at [phone] to schedule this follow-up appointment.    Thank you for choosing [facility]!!

## 2017-06-01 NOTE — Assessment & Plan Note (Signed)
Despite attempts at weight loss and diet change, he continues to only have LDL reduction to 77.  We talked about goal of getting closer to 50.  His wife is currently taking rosuvastatin, and he is interested in converting from atorvastatin to rosuvastatin which would allow lower dose and therefore less side effect and long-term management.  We will also add Zetia and then reassess in roughly 6 months.

## 2017-06-01 NOTE — Assessment & Plan Note (Signed)
Stable,Astigmatic resting bradycardia.  No signs of chronotropic incompetence. Unable to use beta-blocker.

## 2017-06-01 NOTE — Assessment & Plan Note (Signed)
Doing relatively well post angioplasty distal RCA.  At this point, he is 6+ months out from his angioplasty with no signs of recoil.  At this point, his likelihood of restenosis is similar to that of a bare-metal stent.   Continue aspirin Plavix for now.  Would likely continue Plavix for at least another year and stop aspirin. Continue statin, however will convert to total rosuvastatin. With borderline blood pressures and heart rate, and unable to use beta-blocker or ACE inhibitor/ARB.

## 2017-06-01 NOTE — Assessment & Plan Note (Signed)
Occluded RPAV treated with PTCA only.  Unable to advance stent.  No signs or symptoms of recurrent angina or heart failure.  He did have some akinesis in the inferolateral wall, but preserved EF.  No signs of heart failure.  At this time no will need to reassess.

## 2017-06-01 NOTE — Assessment & Plan Note (Signed)
Follow-up echo in roughly July 2020

## 2017-07-17 DIAGNOSIS — Z0000 Encounter for general adult medical examination without abnormal findings: Secondary | ICD-10-CM | POA: Diagnosis not present

## 2017-07-17 DIAGNOSIS — R946 Abnormal results of thyroid function studies: Secondary | ICD-10-CM | POA: Diagnosis not present

## 2017-07-27 DIAGNOSIS — Z1389 Encounter for screening for other disorder: Secondary | ICD-10-CM | POA: Diagnosis not present

## 2017-07-27 DIAGNOSIS — Z0000 Encounter for general adult medical examination without abnormal findings: Secondary | ICD-10-CM | POA: Diagnosis not present

## 2017-07-27 DIAGNOSIS — Z23 Encounter for immunization: Secondary | ICD-10-CM | POA: Diagnosis not present

## 2017-07-27 DIAGNOSIS — E7849 Other hyperlipidemia: Secondary | ICD-10-CM | POA: Diagnosis not present

## 2017-07-27 DIAGNOSIS — R740 Nonspecific elevation of levels of transaminase and lactic acid dehydrogenase [LDH]: Secondary | ICD-10-CM | POA: Diagnosis not present

## 2017-07-27 DIAGNOSIS — I25118 Atherosclerotic heart disease of native coronary artery with other forms of angina pectoris: Secondary | ICD-10-CM | POA: Diagnosis not present

## 2017-07-27 DIAGNOSIS — Z1331 Encounter for screening for depression: Secondary | ICD-10-CM | POA: Diagnosis not present

## 2017-08-18 ENCOUNTER — Telehealth: Payer: Self-pay | Admitting: *Deleted

## 2017-08-18 DIAGNOSIS — E785 Hyperlipidemia, unspecified: Secondary | ICD-10-CM

## 2017-08-18 DIAGNOSIS — I2510 Atherosclerotic heart disease of native coronary artery without angina pectoris: Secondary | ICD-10-CM

## 2017-08-18 DIAGNOSIS — Z9861 Coronary angioplasty status: Secondary | ICD-10-CM

## 2017-08-18 NOTE — Telephone Encounter (Signed)
-----   Message from Tobin ChadSharon Martin V, RN sent at 05/18/2017 12:27 PM EST ----- NEED TO RECHECK LABS LIPID , CMP   WILL NEED TO ORDER AT THAT TIME. FROM LAB WORK 05/15/2017  [email or web address]  4,2019

## 2017-08-18 NOTE — Telephone Encounter (Signed)
MAILED LETTER AND LABSLIP 

## 2017-08-25 DIAGNOSIS — R740 Nonspecific elevation of levels of transaminase and lactic acid dehydrogenase [LDH]: Secondary | ICD-10-CM | POA: Diagnosis not present

## 2017-08-25 DIAGNOSIS — E7849 Other hyperlipidemia: Secondary | ICD-10-CM | POA: Diagnosis not present

## 2017-09-22 ENCOUNTER — Telehealth: Payer: Self-pay | Admitting: Cardiology

## 2017-09-22 NOTE — Telephone Encounter (Signed)
New Message   Pt's wife is calling states pt did his lab work at his pcp and they are suppose to fax the results over. Is that ok to use or do they still need to come get labs. Please call

## 2017-09-22 NOTE — Telephone Encounter (Signed)
Left message to primary's office - to send a copy

## 2017-09-22 NOTE — Telephone Encounter (Signed)
Returned call to wife. She states patient had lab work done at PCP about 1 month ago - she states his liver function was high and med changes were made. These labs are not in Epic, care everywhere, or [facility].   Routed to Hess CorporationSharon RN to follow up on faxed labs

## 2017-09-22 NOTE — Telephone Encounter (Signed)
SPOKE WITH WIFE. INFORMED HER LABS  HAVE BEEN FAXED TO OFFICE. WIFE STARTS THAT DR HOLWERDA STOP ZETIA AFTER LABS WERE DRAWN .   RN INFORMED WIFE IN THAT CASE  PLEASE HAVE LABS DONE PRIOR TO 10/16/17 APPOINTMENT SHE VERBALIZED UNDERSTANDING AND STATES SHE WILL LET HER HUSBAND KNOW.

## 2017-09-23 ENCOUNTER — Other Ambulatory Visit: Payer: Self-pay | Admitting: Cardiology

## 2017-09-24 ENCOUNTER — Other Ambulatory Visit: Payer: Self-pay | Admitting: Cardiology

## 2017-09-24 NOTE — Progress Notes (Signed)
Recent Labs: August 27, 2017 (from PCP -Dr. Link SnufferHolwerda - [facility]).  TC 162, TG 82, HDL 52, LDL 94 -->   Unfortunately, labs went the wrong direction. This is interesting since we added Zetia and switch to a more potent statin.  I will have our clinical pharmacist talk with him when he comes back for follow-up to discuss additional treatment options. Also need to discuss dietary modification & if he is taking his meds.  Bryan Lemmaavid Harding, MD

## 2017-09-25 ENCOUNTER — Telehealth: Payer: Self-pay | Admitting: *Deleted

## 2017-09-25 NOTE — Telephone Encounter (Signed)
Spoke to patient. Result given . Verbalized understanding Patient states Dr Link SnufferHolwerda discontinue zetia due to liver enzymes being elevated in April/May. This results were one month follow up . RN request the first set will follow up at next appointment.  RN CALLED FOR LABS.

## 2017-09-25 NOTE — Telephone Encounter (Signed)
Follow up   [facility] is returning  [facility]

## 2017-09-25 NOTE — Telephone Encounter (Signed)
Left message to call back--in regards to patient lab work from Dr Link SnufferHolwerda office.

## 2017-10-03 ENCOUNTER — Other Ambulatory Visit: Payer: Self-pay | Admitting: Cardiology

## 2017-10-13 ENCOUNTER — Other Ambulatory Visit: Payer: Self-pay | Admitting: Cardiology

## 2017-10-13 NOTE — Telephone Encounter (Signed)
Rx request sent to pharmacy.  

## 2017-10-15 DIAGNOSIS — I350 Nonrheumatic aortic (valve) stenosis: Secondary | ICD-10-CM

## 2017-10-15 HISTORY — DX: Nonrheumatic aortic (valve) stenosis: I35.0

## 2017-10-15 HISTORY — PX: OTHER SURGICAL HISTORY: SHX169

## 2017-10-15 HISTORY — PX: TRANSTHORACIC ECHOCARDIOGRAM: SHX275

## 2017-10-16 ENCOUNTER — Ambulatory Visit: Payer: BLUE CROSS/BLUE SHIELD | Admitting: Cardiology

## 2017-10-16 ENCOUNTER — Encounter: Payer: Self-pay | Admitting: Cardiology

## 2017-10-16 VITALS — BP 122/78 | HR 44 | Ht 70.0 in | Wt 151.4 lb

## 2017-10-16 DIAGNOSIS — I350 Nonrheumatic aortic (valve) stenosis: Secondary | ICD-10-CM

## 2017-10-16 DIAGNOSIS — R5383 Other fatigue: Secondary | ICD-10-CM | POA: Insufficient documentation

## 2017-10-16 DIAGNOSIS — I2510 Atherosclerotic heart disease of native coronary artery without angina pectoris: Secondary | ICD-10-CM

## 2017-10-16 DIAGNOSIS — R001 Bradycardia, unspecified: Secondary | ICD-10-CM | POA: Diagnosis not present

## 2017-10-16 DIAGNOSIS — Z9861 Coronary angioplasty status: Secondary | ICD-10-CM | POA: Diagnosis not present

## 2017-10-16 DIAGNOSIS — E785 Hyperlipidemia, unspecified: Secondary | ICD-10-CM | POA: Diagnosis not present

## 2017-10-16 NOTE — Progress Notes (Signed)
PCP: Alysia Penna, MD  Clinic Note: Chief Complaint  Patient presents with  . Follow-up  . Coronary Artery Disease    no angina  . Fatigue    HPI: Marcus Mills is a 72 y.o. 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 He had PTCA secondary to tortuous RCA. Unable to place stent.  Completed [facility] - now doing maintenance RH.  [name] was last seen on May 29, 2017.  Doing well from cardiac standpoint without cardiac symptoms.  Was on the maintenance program for cardiac rehab.  At that time we stopped atorvastatin 40 mg and changed to rosuvastatin along with Zetia 10 mg. --PCP stopped the Zetia due to elevated liver enzymes.  Recent Hospitalizations:   none  Studies Personally Reviewed - (if available, images/films reviewed: From Epic Chart or Care Everywhere)  N/a  Interval History: Marcus Mills returns today overall doing fairly well.  He really has not had any active angina or heart failure symptoms.  The main thing he notices that his energy level is notably decreased.  He has less "get up and go" --does not feel like exercising.  He really does not feel tired while he is exerting himself,  just does not want to start. He still does work out routinely at [employer] and doing his maintenance current rehab.  He is not necessarily having any chest tightness or dyspnea with rest or exertion.  Is not having any PND orthopnea or edema.  No rapid irregular heartbeats palpitations.  No lightheadedness, dizziness or wooziness.  No syncope/near syncope or TIA/amaurosis fugax. No bleeding issues on dual-antiplatelet agents.  No claudication  ROS: A comprehensive was performed. Review of Systems  Constitutional: Positive for malaise/fatigue (- After initially improving, he now seems to have little motivation and energy to do  things. -He does not prevent him from exercising once he starts.). Negative for weight loss (He took advantage of his weight loss in the hospital, and is continuing to try to work on weight loss with diet and exercise.).  HENT: Negative for congestion and nosebleeds.   Respiratory: Negative for cough, shortness of breath and wheezing.   Gastrointestinal: Negative for abdominal pain, blood in stool, diarrhea and melena.  Genitourinary: Negative for hematuria.  Musculoskeletal: Negative for joint pain and myalgias.  Neurological: Negative for dizziness and weakness.  Endo/Heme/Allergies: Negative for environmental allergies. Does not bruise/bleed easily.  Psychiatric/Behavioral: Negative for memory loss. The patient is not nervous/anxious and does not have insomnia (still wakes up several times @ night.  Does not always feel well rested-because of frequent waking up.).   All other systems reviewed and are negative.   I have reviewed and (if needed) personally updated the patient's problem list, medications, allergies, past medical and surgical history, social and family history.   Past Medical History:  Diagnosis Date  . ALLERGIC RHINITIS 11/04/2006  . CAD S/P percutaneous coronary angioplasty 09/21/2016   PTCA of dRCA/rPAV after rPDA 100% to 30%. Not favorable for Stent 50% p-mLAD & dRCA.  Marland Kitchen Chronic diastolic CHF (congestive heart failure) (HCC)   . HYPERLIPIDEMIA 11/04/2006  . Mild aortic stenosis by prior echocardiogram   . ST elevation myocardial infarction (STEMI) of inferolateral wall (HCC) 09/22/2016   100% dRCA-RPAV (after RPDA)--> PTCA only    Past Surgical History:  Procedure  Laterality Date  . CORONARY BALLOON ANGIOPLASTY N/A 09/22/2016   Procedure: Coronary Balloon Angioplasty;  Surgeon: Lyn Records, MD;  Location[facility] INVASIVE CV LAB;  Service: Cardiovascular:  dRCA-RPAV after RPDA - PTCA only  . LEFT HEART CATH AND CORONARY ANGIOGRAPHY N/A 09/22/2016   Procedure: Left Heart Cath  and Coronary Angiography;  Surgeon: Lyn Records, MD;  Location: [facility] INVASIVE CV LAB;  Service: Cardiovascular: FINDINGS: 100% dRCA beyond PDA (in RPAV) - PTCA only 2/2 tortuosity (no stent). 50% p-mLAD & distal RCA prior to bifurcation.    . TONSILLECTOMY    . TRANSTHORACIC ECHOCARDIOGRAM  09/22/2016   EF 55-60%. Akinesis of the basal-mid inferolateral wall. GR 2 DD. Mild aortic stenosis. Mild aortic root dilation. Mild MR.    Current Meds  Medication Sig  . aspirin 81 MG chewable tablet Chew 1 tablet (81 mg total) by mouth daily. (Patient taking differently: Chew 81 mg by mouth every evening. )  . clopidogrel (PLAVIX) 75 MG tablet TAKE 1 TABLET BY MOUTH DAILY WITH BREAKFAST  . Coenzyme Q10 (CO Q 10 PO) Take 1 tablet by mouth daily.  . Ergocalciferol (VITAMIN D2 PO) Take 1 tablet by mouth daily.  . Multiple Vitamins-Minerals (ICAPS AREDS 2) CAPS Take 2 capsules by mouth daily.  . nitroGLYCERIN (NITROSTAT) 0.4 MG SL tablet Place 1 tablet (0.4 mg total) under the tongue every 5 (five) minutes as needed for chest pain.    No Known Allergies  Social History   Tobacco Use  . Smoking status: Former Smoker    Last attempt to quit: 04/26/1987    Years since quitting: 30.4  . Smokeless tobacco: Never Used  Substance Use Topics  . Alcohol use: Yes    Alcohol/week: 0.6 oz    Types: 1 Standard drinks or equivalent per week  . Drug use: No   Social History   Social History Narrative  . Not on file     family history includes Breast cancer in his sister; Cancer in his father and mother; Hypertension in his mother.  Wt Readings from Last 3 Encounters:  10/16/17 151 lb 6.4 oz (68.7 kg)  05/29/17 156 lb 3.2 oz (70.9 kg)  02/26/17 160 lb 12.8 oz (72.9 kg)  -intentional    PHYSICAL EXAM BP 122/78   Pulse (!) 44   Ht 5\' 10"  (1.778 m)   Wt 151 lb 6.4 oz (68.7 kg)   BMI 21.72 kg/m   Physical Exam  Constitutional: He is oriented to person, place, and time. He appears well-developed  and well-nourished. No distress.  HENT:  Head: Normocephalic and atraumatic.  Neck: Normal range of motion. Neck supple. No hepatojugular reflux and no JVD present. Carotid bruit is not present.  Cardiovascular: Normal rate and regular rhythm.  No extrasystoles are present. Exam reveals no gallop and no friction rub.  Murmur heard.  Medium-pitched harsh crescendo-decrescendo early systolic murmur is present with a grade of 2/6 at the upper right sternal border and upper left sternal border. Pulmonary/Chest: Effort normal and breath sounds normal. No respiratory distress. He has no wheezes.  Abdominal: Soft. Bowel sounds are normal. He exhibits no distension. There is no tenderness. There is no rebound.  No HSM  Musculoskeletal: Normal range of motion. He exhibits no edema.  Neurological: He is alert and oriented to person, place, and time.  Psychiatric: He has a normal mood and affect. His behavior is normal. Judgment and thought content normal.  Nursing note and vitals reviewed.  Adult ECG Report Not checked  Other studies Reviewed: Additional studies/ records that were reviewed today include:  Recent Labs:     Jul 17, 2017: LDL 54.  HDL 55.  TG 90.  Total cholesterol 127.  TSH 5.12 (little high).   Na+ 140, K+ 4.6, Cl- 101, HCO3- 26 , BUN 18, Cr 0.73, Glu 91, Ca2+ 9.6; AST 74, ALT 124, AlkP 69 (Zetia d/c'd by PCP)  CBC: W 4.9, H/H 13.8/41.7, Plt 161  08/27/2017 - off of Zetia: TC 162, TG 92, HDL 52, LDL 94* (quite a change)  ASSESSMENT / PLAN: Problem List Items Addressed This Visit    Bradycardia, sinus (Chronic)    Interestingly, he still has bradycardia which is unusual 1 year out from his MI.  He is not on a beta-blocker or  any other AV nodal agent  .   I am a little bit concerned about his fatigue and potentially being related to bradycardia.  Plan : 48 hr monitor to determine extent of bradycardia & HR response to activity.      Relevant Orders   EKG 12-Lead    HOLTER MONITOR - 48 HOUR   CAD S/P percutaneous coronary angioplasty - Primary (Chronic)    Well status post PTCA of the RCA.  Had not had any issues until this visit when he started to notice some fatigue.  It does not sound like this is his anginal equivalent and I therefore do not necessarily need to recheck a stress test.  Results of echocardiogram, we may need to check an ischemic evaluation of the RCA.  For now continue Plavix and okay to stop aspirin. Although we are dealing with dyslipidemia issues, I think he would like to give him some time off of statin and statin holiday to see if this helps his fatigue.  It would also potentially help [place] manage his lipids going forward.      Relevant Orders   EKG 12-Lead   Fatigue due to treatment    Not sure if this is related to statin or not -- will allow statin holiday.  Not sure if related to bradycardia - 48 hr monitor       Relevant Orders   HOLTER MONITOR - 48 HOUR   Hyperlipidemia with target low density lipoprotein (LDL) cholesterol less than 70 mg/dL (Chronic)    Unusually dramatic rise in LDL after stopping Zetia - was very well controlled in May - but LDL up to 94 from 54 in 1 month.  Need to consider other alternatives since Zetia seems to have made a dramatic difference.  Am allowing for statin holiday of ~1 week. He will then restart @ 20 mg. -- will need to reassess in ~2 weeks. --> anticipate referring to [facility] for potentially enrolling in [place] trial.      Relevant Orders   Lipid panel   Mild aortic stenosis by prior echocardiogram (Chronic)    Plan was to follow-up next year, however since he is now having some worsening symptoms of dyspnea and fatigue as well as dizziness that could be considered his near syncope, we will evaluate with an echocardiogram to ensure that it has not progressed.      Relevant Orders   ECHOCARDIOGRAM COMPLETE      Current medicines are reviewed at length with the patient today. (+/-  concerns) None The following changes have been made: None  Patient Instructions  Medication Instructions:   HOLD crestor for 1 week RESUME crestor at 20mg   daily  Labwork:  FASTING lab work early September to check cholesterol  Testing/Procedures:  Your physician has recommended that you wear a holter monitor for 48 hours. Holter monitors are medical devices that record the heart's electrical activity. Doctors most often use these monitors to diagnose arrhythmias. Arrhythmias are problems with the speed or rhythm of the heartbeat. The monitor is a small, portable device. You can wear one while you do your normal daily activities. This is usually used to diagnose what is causing palpitations/syncope (passing out).  Your physician has requested that you have an echocardiogram. Echocardiography is a painless test that uses sound waves to create images of your heart. It provides your doctor with information about the size and shape of your heart and how well your heart's chambers and valves are working. This procedure takes approximately one hour. There are no restrictions for this procedure.  ** both the echo & monitor appointments are at [address] - [address] Floor  Follow-Up:  Your physician recommends that you schedule a follow-up appointment in: 2-3 months   If you need a refill on your cardiac medications before your next appointment, please call your pharmacy.  Any Other Special Instructions Will Be Listed Below (If Applicable).   Studies Ordered:   Orders Placed This Encounter  Procedures  . Lipid panel  . HOLTER MONITOR - 48 HOUR  . EKG 12-Lead  . ECHOCARDIOGRAM COMPLETE      Bryan Lemmaavid Dailey Buccheri, M.D., M.S. Interventional Cardiologist   Pager # [phone] Phone # [phone] [address] [place], [place] [id number]

## 2017-10-16 NOTE — Patient Instructions (Addendum)
Medication Instructions:   HOLD crestor for 1 week RESUME crestor at 20mg  daily  Labwork:  FASTING lab work early September to check cholesterol  Testing/Procedures:  Your physician has recommended that you wear a holter monitor for 48 hours. Holter monitors are medical devices that record the heart's electrical activity. Doctors most often use these monitors to diagnose arrhythmias. Arrhythmias are problems with the speed or rhythm of the heartbeat. The monitor is a small, portable device. You can wear one while you do your normal daily activities. This is usually used to diagnose what is causing palpitations/syncope (passing out).  Your physician has requested that you have an echocardiogram. Echocardiography is a painless test that uses sound waves to create images of your heart. It provides your doctor with information about the size and shape of your heart and how well your heart's chambers and valves are working. This procedure takes approximately one hour. There are no restrictions for this procedure.  ** both the echo & monitor appointments are at [address]  Follow-Up:  Your physician recommends that you schedule a follow-up appointment in: 2-3 months   If you need a refill on your cardiac medications before your next appointment, please call your pharmacy.  Any Other Special Instructions Will Be Listed Below (If Applicable).

## 2017-10-17 ENCOUNTER — Encounter: Payer: Self-pay | Admitting: Cardiology

## 2017-10-17 NOTE — Assessment & Plan Note (Addendum)
Well status post PTCA of the RCA.  Had not had any issues until this visit when he started to notice some fatigue.  It does not sound like this is his anginal equivalent and I therefore do not necessarily need to recheck a stress test.  Results of echocardiogram, we may need to check an ischemic evaluation of the RCA.  For now continue Plavix and okay to stop aspirin. Although we are dealing with dyslipidemia issues, I think he would like to give him some time off of statin and statin holiday to see if this helps his fatigue.  It would also potentially help us manage his lipids going forward.

## 2017-10-17 NOTE — Assessment & Plan Note (Signed)
Unusually dramatic rise in LDL after stopping Zetia - was very well controlled in May - but LDL up to 94 from 54 in 1 month.  Need to consider other alternatives since Zetia seems to have made a dramatic difference.  Am allowing for statin holiday of ~1 week. He will then restart @ 20 mg. -- will need to reassess in ~2 weeks. --> anticipate referring to [facility] for potentially enrolling in [place] trial.

## 2017-10-17 NOTE — Assessment & Plan Note (Signed)
Plan was to follow-up next year, however since he is now having some worsening symptoms of dyspnea and fatigue as well as dizziness that could be considered his near syncope, we will evaluate with an echocardiogram to ensure that it has not progressed.

## 2017-10-17 NOTE — Assessment & Plan Note (Signed)
Not sure if this is related to statin or not -- will allow statin holiday.  Not sure if related to bradycardia - 48 hr monitor

## 2017-10-17 NOTE — Assessment & Plan Note (Signed)
Interestingly, he still has bradycardia which is unusual 1 year out from his MI.  He is not on a beta-blocker or  any other AV nodal agent  .   I am a little bit concerned about his fatigue and potentially being related to bradycardia.  Plan : 48 hr monitor to determine extent of bradycardia & HR response to activity.

## 2017-10-26 ENCOUNTER — Other Ambulatory Visit: Payer: Self-pay

## 2017-10-26 ENCOUNTER — Ambulatory Visit (INDEPENDENT_AMBULATORY_CARE_PROVIDER_SITE_OTHER): Payer: BLUE CROSS/BLUE SHIELD

## 2017-10-26 ENCOUNTER — Ambulatory Visit (HOSPITAL_COMMUNITY): Payer: BLUE CROSS/BLUE SHIELD | Attending: Cardiology

## 2017-10-26 ENCOUNTER — Telehealth: Payer: Self-pay | Admitting: Cardiology

## 2017-10-26 DIAGNOSIS — I2510 Atherosclerotic heart disease of native coronary artery without angina pectoris: Secondary | ICD-10-CM | POA: Insufficient documentation

## 2017-10-26 DIAGNOSIS — I252 Old myocardial infarction: Secondary | ICD-10-CM | POA: Insufficient documentation

## 2017-10-26 DIAGNOSIS — I350 Nonrheumatic aortic (valve) stenosis: Secondary | ICD-10-CM | POA: Insufficient documentation

## 2017-10-26 DIAGNOSIS — R001 Bradycardia, unspecified: Secondary | ICD-10-CM | POA: Diagnosis not present

## 2017-10-26 DIAGNOSIS — I509 Heart failure, unspecified: Secondary | ICD-10-CM | POA: Insufficient documentation

## 2017-10-26 DIAGNOSIS — E785 Hyperlipidemia, unspecified: Secondary | ICD-10-CM | POA: Diagnosis not present

## 2017-10-26 DIAGNOSIS — I77810 Thoracic aortic ectasia: Secondary | ICD-10-CM | POA: Insufficient documentation

## 2017-10-26 DIAGNOSIS — R5383 Other fatigue: Secondary | ICD-10-CM

## 2017-10-26 MED ORDER — ROSUVASTATIN CALCIUM 20 MG PO TABS: 20.0000 mg | ORAL_TABLET | Freq: Every day | ORAL | 3 refills | Status: DC

## 2017-10-26 NOTE — Telephone Encounter (Signed)
New Message:      Pt c/o medication issue:  1. Name of Medication: rosuvastatin (CRESTOR) 40 MG tablet(Expired)  2. How are you currently taking this medication (dosage and times per day)? Take 1 tablet (40 mg total) by mouth daily.  3. Are you having a reaction (difficulty breathing--STAT)? No  4. What is your medication issue? Pt states he was told to cut this medication in half and pt states the pharmacy said he can not do that being the pill is slow release. Pt states the pharmacy ask that we send in a new prescription for the 20 mg tablet if that is what the dr is requesting the pt take. The pt needs this medication sent to the [facility] in [place]

## 2017-10-26 NOTE — Telephone Encounter (Signed)
Advised pt that the Rx for 20mg  Crest[facility] daily [facility] [place].  Patient voiced appreciation for the call and verbalized understanding.

## 2017-11-10 ENCOUNTER — Telehealth: Payer: Self-pay | Admitting: Cardiology

## 2017-11-10 NOTE — Telephone Encounter (Signed)
Result Notes for ECHOCARDIOGRAM COMPLETE   Notes recorded by Rollene RotundaHochrein, Jakoby, MD on 10/27/2017 at 5:33 PM EDT AS is reported as moderate and can be followed clinically. No change in therapy at this time. Call Mr. Mikal PlaneWagner Jr with the results and send results to Alysia PennaHolwerda, Scott, MD

## 2017-11-10 NOTE — Telephone Encounter (Signed)
Patient informed and copy sent to PCP. 

## 2017-11-10 NOTE — Telephone Encounter (Signed)
New Message:   Pt is calling about test results

## 2017-11-27 DIAGNOSIS — E785 Hyperlipidemia, unspecified: Secondary | ICD-10-CM | POA: Diagnosis not present

## 2017-11-28 LAB — LIPID PANEL
CHOL/HDL RATIO: 2.8 ratio (ref 0.0–5.0)
Cholesterol, Total: 182 mg/dL (ref 100–199)
HDL: 64 mg/dL (ref >39)
LDL Calculated: 100 mg/dL — ABNORMAL HIGH (ref 0–99)
Triglycerides: 91 mg/dL (ref 0–149)
VLDL CHOLESTEROL CAL: 18 mg/dL (ref 5–40)

## 2017-12-03 ENCOUNTER — Telehealth: Payer: Self-pay | Admitting: *Deleted

## 2017-12-03 NOTE — Telephone Encounter (Signed)
Left message to call back - set up appointment with [facility]- CHOLESTEROL PATIENT RE[facility]

## 2017-12-03 NOTE — Telephone Encounter (Signed)
-----   Message from Marykay Lexavid W Harding, MD sent at 11/29/2017 10:41 PM EDT ----- Unfortunately, her cholesterol levels have gone the wrong way.  Because of concern for abnormal liver function tests on Zetia as well as other reasons to put statins on hold, we probably are not to get there with low doses of statins with or without Zetia.  As part of her follow-up, to have Mr. Fransisco BeauWaggoner follow-up in our lipid clinic ([facility] with our clinical pharmacists -- preferentially prior to his f/u with me).  Bryan Lemmaavid Harding, MD

## 2017-12-04 NOTE — Telephone Encounter (Signed)
Spoke with pt. Pt aware of his lab results and Dr.Hardings recommendation.  Notes recorded by Marykay LexHarding, David W, MD on 11/29/2017 at 10:41 PM EDT Unfortunately, her cholesterol levels have gone the wrong way. Because of concern for abnormal liver function tests on Zetia as well as other reasons to put statins on hold, we probably are not to get there with low doses of statins with or without Zetia.  Adv pt that I will fwd the message to scheduling to call him to schedule a lipid clinic appt.

## 2017-12-04 NOTE — Telephone Encounter (Signed)
Follow up ° °Pt returning call for nurse °

## 2017-12-04 NOTE — Telephone Encounter (Signed)
SPOKE WITH PATIENT . PER KRISTIN , PATIENT WILL BE SEEN THE SAME AS OFFICE APPT 10/719WITH DR HARDING . PATIENT AWARE VERBALIZED UNDERSTANDING.

## 2017-12-21 ENCOUNTER — Other Ambulatory Visit: Payer: Self-pay | Admitting: Pharmacist

## 2017-12-21 ENCOUNTER — Encounter: Payer: Self-pay | Admitting: Cardiology

## 2017-12-21 ENCOUNTER — Ambulatory Visit: Payer: BLUE CROSS/BLUE SHIELD | Admitting: Cardiology

## 2017-12-21 VITALS — BP 118/82 | HR 61 | Ht 70.0 in | Wt 157.0 lb

## 2017-12-21 DIAGNOSIS — I2510 Atherosclerotic heart disease of native coronary artery without angina pectoris: Secondary | ICD-10-CM | POA: Diagnosis not present

## 2017-12-21 DIAGNOSIS — I350 Nonrheumatic aortic (valve) stenosis: Secondary | ICD-10-CM | POA: Diagnosis not present

## 2017-12-21 DIAGNOSIS — R55 Syncope and collapse: Secondary | ICD-10-CM

## 2017-12-21 DIAGNOSIS — E785 Hyperlipidemia, unspecified: Secondary | ICD-10-CM | POA: Diagnosis not present

## 2017-12-21 DIAGNOSIS — R001 Bradycardia, unspecified: Secondary | ICD-10-CM | POA: Diagnosis not present

## 2017-12-21 DIAGNOSIS — Z9861 Coronary angioplasty status: Secondary | ICD-10-CM

## 2017-12-21 DIAGNOSIS — Z23 Encounter for immunization: Secondary | ICD-10-CM | POA: Diagnosis not present

## 2017-12-21 NOTE — Progress Notes (Signed)
PCP: Alysia Penna, MD  Clinic Note: Chief Complaint  Patient presents with  . Follow-up    Echo results and monitor results    HPI: Marcus Mills is a 72 y.o. male with a PMH notable for Inf STEMI & syncope who presents today for 2 month follow-up to discuss the results of his echocardiogram showing moderate aortic stenosis.  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 He had PTCA secondary to tortuous RCA. Unable to place stent.  Completed [facility] - now doing maintenance RH.  Marcus Mills was last seen on 10/16/17:  - hold Crestor x 1 wk & restart @ 20 mg daily.  Recent Hospitalizations:   none  Studies Personally Reviewed - (if available, images/films reviewed: From Epic Chart or Care Everywhere)  Echo 8/12: EF 60-65%.  Mild Inferolateral HK. Gr1 DD. Nickie Retort AS (mean gradient 20 mmHg, Peak 37 mmHg). Mild Ao Root dilation - 37 mm.   Holter monitor: Mostly sinus rhythm/sinus bradycardia. Average heart rate 55 bpm. Minimum heart rate 37 bpm with maximum heart rate 131 bpm. Very rare PACs and PVCs. Multiform PVCs -no periods of bigeminy. 37 atrial pairs noted.  No arrhythmias: No A. fib, SVT, PAT or VF/VT  Overall no arrhythmias and rare ectopy.  Good majority of the monitor time was sinus bradycardia --> however, there is clear evidence of ability to increase heart rate appropriately. Not quite yet criteria for considering pacemaker.  Interval History: Marcus Mills returns today overall doing fairly well.  He has several questions about the monitor and echocardiogram.  Otherwise he is quite stable from a cardiac standpoint.  He is energy level really did not change that much with changing his Crestor dose.  He is due to see our clinical pharmacist with [facility] lipid clinic today. He really is doing fine however and still working out of the gym and also doing his maintenance rehab.  No chest tightness pressure with rest or exertion.  No resting or exertional  dyspnea.  No PND, orthopnea or edema.  No rapid irregular heartbeats palpitations.  No syncope/near syncope or TIA/amorous fugax.  No bleeding issues on antiplatelet agent.  No claudication  ROS: A comprehensive was performed. Review of Systems  Constitutional: Positive for malaise/fatigue (Still just somewhat diminished get up and go, but better.  Did not really change with holding Crestor.). Negative for weight loss (Actually put back on some weight.  Feels a bit better now.).  HENT: Negative for congestion and nosebleeds.   Respiratory: Negative for cough, shortness of breath and wheezing.   Gastrointestinal: Negative for abdominal pain, blood in stool, diarrhea and melena.  Genitourinary: Negative for hematuria.  Musculoskeletal: Negative for joint pain and myalgias.  Neurological: Negative for dizziness and weakness.  Endo/Heme/Allergies: Negative for environmental allergies. Does not bruise/bleed easily.  Psychiatric/Behavioral: Negative for memory loss. The patient is not nervous/anxious and does not have insomnia (still wakes up several times @ night.  Does not always feel well rested-because of frequent waking up.).   All other systems reviewed and are negative.   I have reviewed and (if needed) personally updated the patient's problem list, medications, allergies, past medical and surgical history, social and family history.   Past Medical History:  Diagnosis Date  . ALLERGIC RHINITIS 11/04/2006  . CAD S/P percutaneous coronary angioplasty 09/21/2016   PTCA of dRCA/rPAV after rPDA 100% to 30%. Not favorable for Stent 50% p-mLAD & dRCA.  Marland Kitchen Chronic diastolic CHF (congestive heart  failure) (HCC)   . HYPERLIPIDEMIA 11/04/2006  . Moderate aortic stenosis by prior echocardiogram 10/2017   Mod AS (mean gradient 20 mmHg, Peak 37 mmHg). Mild Ao Root dilation - 37 mm.  . ST elevation myocardial infarction (STEMI) of inferolateral wall (HCC) 09/22/2016   100% dRCA-RPAV (after RPDA)--> PTCA  only    Past Surgical History:  Procedure Laterality Date  . CORONARY BALLOON ANGIOPLASTY N/A 09/22/2016   Procedure: Coronary Balloon Angioplasty;  Surgeon: Lyn Records, MD;  Location: [facility] INVASIVE CV LAB;  Service: Cardiovascular:  dRCA-RPAV after RPDA - PTCA only  . HOLTER MONITOR  10/2017    Mostly sinus rhythm/sinus bradycardia. Average heart rate 55 bpm. Minimum heart rate 37 bpm with maximum heart rate 131 bpm. Very rare PACs and PVCs. Multiform PVCs -no periods of bigeminy. 37 atrial pairs noted.  No arrhythmias: No A. fib, SVT, PAT or VF/VT  . LEFT HEART CATH AND CORONARY ANGIOGRAPHY N/A 09/22/2016   Procedure: Left Heart Cath and Coronary Angiography;  Surgeon: Lyn Records, MD;  Location: [facility] INVASIVE CV LAB;  Service: Cardiovascular: FINDINGS: 100% dRCA beyond PDA (in RPAV) - PTCA only 2/2 tortuosity (no stent). 50% p-mLAD & distal RCA prior to bifurcation.    . TONSILLECTOMY    . TRANSTHORACIC ECHOCARDIOGRAM  09/22/2016   EF 55-60%. Akinesis of the basal-mid inferolateral wall. GR 2 DD. Mild aortic stenosis. Mild aortic root dilation. Mild MR.  . TRANSTHORACIC ECHOCARDIOGRAM  10/2017    EF 60-65%.  Mild Inferolateral HK. Gr1 DD. Nickie Retort AS (mean gradient 20 mmHg, Peak 37 mmHg). Mild Ao Root dilation - 37 mm.     Current Meds  Medication Sig  . CALCIUM-MAGNESIUM-ZINC PO Take 1 tablet by mouth daily.  . clopidogrel (PLAVIX) 75 MG tablet TAKE 1 TABLET BY MOUTH DAILY WITH BREAKFAST  . Coenzyme Q10 (CO Q 10 PO) Take 1 tablet by mouth daily.  . Ergocalciferol (VITAMIN D2 PO) Take 1 tablet by mouth daily.  . Multiple Vitamins-Minerals (ICAPS AREDS 2) CAPS Take 2 capsules by mouth daily.  . nitroGLYCERIN (NITROSTAT) 0.4 MG SL tablet Place 1 tablet (0.4 mg total) under the tongue every 5 (five) minutes as needed for chest pain.  . rosuvastatin (CRESTOR) 20 MG tablet Take 1 tablet (20 mg total) by mouth daily.    No Known Allergies  Social History   Tobacco Use  . Smoking status:  Former Smoker    Last attempt to quit: 04/26/1987    Years since quitting: 30.6  . Smokeless tobacco: Never Used  Substance Use Topics  . Alcohol use: Yes    Alcohol/week: 1.0 standard drinks    Types: 1 Standard drinks or equivalent per week  . Drug use: No   Social History   Social History Narrative  . Not on file     family history includes Breast cancer in his sister; Cancer in his father and mother; Hypertension in his mother.  Wt Readings from Last 3 Encounters:  12/21/17 157 lb (71.2 kg)  10/16/17 151 lb 6.4 oz (68.7 kg)  05/29/17 156 lb 3.2 oz (70.9 kg)  -intentional    PHYSICAL EXAM BP 118/82   Pulse 61   Ht 5\' 10"  (1.778 m)   Wt 157 lb (71.2 kg)   BMI 22.53 kg/m  Physical Exam  Constitutional: He is oriented to person, place, and time. He appears well-developed and well-nourished. No distress.  HENT:  Head: Normocephalic and atraumatic.  Neck: Normal range of motion. Neck supple. No  hepatojugular reflux and no JVD present. Carotid bruit is not present.  Cardiovascular: Normal rate and regular rhythm.  No extrasystoles are present. PMI is not displaced. Exam reveals no gallop and no friction rub.  Murmur heard.  Medium-pitched harsh crescendo-decrescendo early systolic murmur is present with a grade of 3/6 at the upper right sternal border and upper left sternal border. Pulmonary/Chest: Effort normal and breath sounds normal. No respiratory distress. He has no wheezes.  Musculoskeletal: Normal range of motion. He exhibits no edema.  Neurological: He is alert and oriented to person, place, and time.  Psychiatric: He has a normal mood and affect. His behavior is normal. Judgment and thought content normal.  Nursing note and vitals reviewed.    Adult ECG Report Not checked  Other studies Reviewed: Additional studies/ records that were reviewed today include:  Recent Labs:     Jul 17, 2017: LDL 54.  HDL 55.  TG 90.  Total cholesterol 127.  TSH 5.12 (little  high).   Na+ 140, K+ 4.6, Cl- 101, HCO3- 26 , BUN 18, Cr 0.73, Glu 91, Ca2+ 9.6; AST 74, ALT 124, AlkP 69 (Zetia d/c'd by PCP)  CBC: W 4.9, H/H 13.8/41.7, Plt 161  08/27/2017 - off of Zetia: TC 162, TG 92, HDL 52, LDL 94* (quite a change)  ASSESSMENT / PLAN: Problem List Items Addressed This Visit    Bradycardia, sinus (Chronic)    Still has sinus bradycardia, monitor results reviewed.  No pauses or arrhythmias besides sinus bradycardia. No current indication for pacemaker.  This may has some do with his "lack of get up and go ".  But otherwise would not consider pacemaker since he hasbpm no signs of chronotropic incompetence -able to get heart rate up to 131 bpm.  Avoid AV nodal agents: Beta-blocker, non-dihydropyridine calcium channel blockers, digoxin      CAD S/P percutaneous coronary angioplasty - Primary (Chronic)    Doing well after PTCA of the RCA.  No stent placed at that time. No recurrent anginal symptoms.  I suspect that the PTCA result now has a BMS stent-like effect.  Unless he has recurrent symptoms, I would not reevaluate. Maintain on Plavix without aspirin.  But would be okay to stop if necessary for procedures. Back on statin now, but referring to [facility] for lipid management. Blood pressure stable does not require any ACE inhibitor or ARB. No beta-blocker due to bradycardia.      Hyperlipidemia with target low density lipoprotein (LDL) cholesterol less than 70 mg/dL (Chronic)    Still not able to get down to goal on current dose of Crestor. Being followed now by [facility] -we will actually be seen today after this visit.      Moderate aortic stenosis by prior echocardiogram (Chronic)    I do not think that any of his symptoms were related to the aortic stenosis.  Seems to be relatively stable moderate aortic stenosis.  Can follow-up next year before his fall visit.  I did spend some time explaining to him the pathophysiology and pattern of progression of aortic valve  disease.  At this point I do not think he is at a point which she would be having symptoms.  We did discuss symptoms however.      Relevant Orders   ECHOCARDIOGRAM COMPLETE   Syncope (Chronic)    No further episodes.  Avoiding antihypertensives because of his tendency to drop his blood pressures       Other Visit Diagnoses  Need for immunization against influenza       Relevant Orders   Flu Vaccine QUAD 36+ mos IM (Completed)      Current medicines are reviewed at length with the patient today. (+/- concerns) None The following changes have been made: None  Patient Instructions  Medication Instructions:  No changes If you need a refill on your cardiac medications before your next appointment, please call your pharmacy.   Lab work: Not needed If you have labs (blood work) drawn today and your tests are completely normal, you will receive your results only by: Marland Kitchen MyChart Message (if you have MyChart) OR . A paper copy in the mail If you have any lab test that is abnormal or we need to change your treatment, we will call you to review the results.  Testing/Procedures: Schedule at [facility] [address]- IN AUG 2020. Your physician has requested that you have an echocardiogram. Echocardiography is a painless test that uses sound waves to create images of your heart. It provides your doctor with information about the size and shape of your heart and how well your heart's chambers and valves are working. This procedure takes approximately one hour. There are no restrictions for this procedure.    Follow-Up: At [facility], you and your health needs are our priority.  As part of our continuing mission to provide you with exceptional heart care, we have created designated Provider Care Teams.  These Care Teams include your primary Cardiologist (physician) and Advanced Practice Providers (APPs -  Physician Assistants and Nurse Practitioners) who all work together to  provide you with the care you need, when you need it. You will need a follow up appointment in 11 months ( SEPT 2020).  Please call our office 2 months in advance to schedule this appointment.  You may see Bryan Lemma, MD or one of the following Advanced Practice Providers on your designated Care Team:   Theodore Demark, PA-C . Joni Reining, DNP, ANP  Any Other Special Instructions Will Be Listed Below (If Applicable).    Studies Ordered:   Orders Placed This Encounter  Procedures  . Flu Vaccine QUAD 36+ mos IM  . ECHOCARDIOGRAM COMPLETE      Bryan Lemma, M.D., M.S. Interventional Cardiologist   Pager # [phone] Phone # [phone] [address] [place], [place] [address]

## 2017-12-21 NOTE — Patient Instructions (Signed)
Medication Instructions:  No changes If you need a refill on your cardiac medications before your next appointment, please call your pharmacy.   Lab work: Not needed If you have labs (blood work) drawn today and your tests are completely normal, you will receive your results only by: Marland Kitchen MyChart Message (if you have MyChart) OR . A paper copy in the mail If you have any lab test that is abnormal or we need to change your treatment, we will call you to review the results.  Testing/Procedures: Schedule at [facility] [address] IN AUG 2020. Your physician has requested that you have an echocardiogram. Echocardiography is a painless test that uses sound waves to create images of your heart. It provides your doctor with information about the size and shape of your heart and how well your heart's chambers and valves are working. This procedure takes approximately one hour. There are no restrictions for this procedure.    Follow-Up: At [facility], you and your health needs are our priority.  As part of our continuing mission to provide you with exceptional heart care, we have created designated Provider Care Teams.  These Care Teams include your primary Cardiologist (physician) and Advanced Practice Providers (APPs -  Physician Assistants and Nurse Practitioners) who all work together to provide you with the care you need, when you need it. You will need a follow up appointment in 11 months ( SEPT 2020).  Please call our office 2 months in advance to schedule this appointment.  You may see Bryan Lemma, MD or one of the following Advanced Practice Providers on your designated Care Team:   Theodore Demark, PA-C . Joni Reining, DNP, ANP  Any Other Special Instructions Will Be Listed Below (If Applicable).

## 2017-12-22 ENCOUNTER — Encounter: Payer: Self-pay | Admitting: Cardiology

## 2017-12-22 NOTE — Assessment & Plan Note (Signed)
Doing well after PTCA of the RCA.  No stent placed at that time. No recurrent anginal symptoms.  I suspect that the PTCA result now has a BMS stent-like effect.  Unless he has recurrent symptoms, I would not reevaluate. Maintain on Plavix without aspirin.  But would be okay to stop if necessary for procedures. Back on statin now, but referring to [facility] for lipid management. Blood pressure stable does not require any ACE inhibitor or ARB. No beta-blocker due to bradycardia.

## 2017-12-22 NOTE — Assessment & Plan Note (Signed)
Still has sinus bradycardia, monitor results reviewed.  No pauses or arrhythmias besides sinus bradycardia. No current indication for pacemaker.  This may has some do with his "lack of get up and go ".  But otherwise would not consider pacemaker since he hasbpm no signs of chronotropic incompetence -able to get heart rate up to 131 bpm.  Avoid AV nodal agents: Beta-blocker, non-dihydropyridine calcium channel blockers, digoxin

## 2017-12-22 NOTE — Assessment & Plan Note (Signed)
I do not think that any of his symptoms were related to the aortic stenosis.  Seems to be relatively stable moderate aortic stenosis.  Can follow-up next year before his fall visit.  I did spend some time explaining to him the pathophysiology and pattern of progression of aortic valve disease.  At this point I do not think he is at a point which she would be having symptoms.  We did discuss symptoms however.

## 2017-12-22 NOTE — Assessment & Plan Note (Signed)
No further episodes.  Avoiding antihypertensives because of his tendency to drop his blood pressures

## 2017-12-22 NOTE — Assessment & Plan Note (Signed)
Still not able to get down to goal on current dose of Crestor. Being followed now by CV [facility] -we will actually be seen today after this visit.

## 2017-12-28 NOTE — Telephone Encounter (Signed)
PCSK9i discussed with patient ans spouse.  Will initiate paperwork and follow up with patient as needed.   Indication, MOA, common side effects , financial responsibility, and patient assistance program discussed with patient.

## 2017-12-31 ENCOUNTER — Telehealth: Payer: Self-pay | Admitting: Pharmacist

## 2017-12-31 ENCOUNTER — Other Ambulatory Visit: Payer: Self-pay | Admitting: Pharmacist

## 2017-12-31 MED ORDER — EVOLOCUMAB WITH INFUSOR 420 MG/3.5ML ~~LOC~~ SOCT: 420.0000 mg | SUBCUTANEOUS | 11 refills | Status: DC

## 2017-12-31 NOTE — Telephone Encounter (Signed)
PA approved by insurance. Rx sent to prefer pharmacy.  Patient will call to schedule day for 1st injection at the office.

## 2018-02-01 DIAGNOSIS — D3131 Benign neoplasm of right choroid: Secondary | ICD-10-CM | POA: Diagnosis not present

## 2018-02-01 DIAGNOSIS — H353131 Nonexudative age-related macular degeneration, bilateral, early dry stage: Secondary | ICD-10-CM | POA: Diagnosis not present

## 2018-03-31 ENCOUNTER — Telehealth: Payer: Self-pay

## 2018-03-31 DIAGNOSIS — E785 Hyperlipidemia, unspecified: Secondary | ICD-10-CM

## 2018-03-31 NOTE — Addendum Note (Signed)
Addended by: Eather Colas on: 03/31/2018 01:50 PM   Modules accepted: Orders

## 2018-03-31 NOTE — Telephone Encounter (Signed)
Called pt left message to call office back did not leave detailed but need to ask are they still taking repatha pushtronix, order labs

## 2018-03-31 NOTE — Telephone Encounter (Signed)
Called pt to get them to come have labs for lipids

## 2018-04-07 NOTE — Telephone Encounter (Signed)
Called pt to remind them that labs were ordered and to come into the office to have that done fasting

## 2018-04-09 DIAGNOSIS — E785 Hyperlipidemia, unspecified: Secondary | ICD-10-CM | POA: Diagnosis not present

## 2018-04-09 DIAGNOSIS — I2510 Atherosclerotic heart disease of native coronary artery without angina pectoris: Secondary | ICD-10-CM | POA: Diagnosis not present

## 2018-04-09 DIAGNOSIS — Z9861 Coronary angioplasty status: Secondary | ICD-10-CM | POA: Diagnosis not present

## 2018-04-09 LAB — HEPATIC FUNCTION PANEL
ALT: 23 IU/L (ref 0–44)
AST: 23 IU/L (ref 0–40)
Albumin: 4.4 g/dL (ref 3.8–4.8)
Alkaline Phosphatase: 61 IU/L (ref 39–117)
Bilirubin Total: 0.5 mg/dL (ref 0.0–1.2)
Bilirubin, Direct: 0.12 mg/dL (ref 0.00–0.40)
TOTAL PROTEIN: 6.3 g/dL (ref 6.0–8.5)

## 2018-04-09 LAB — LIPID PANEL
Chol/HDL Ratio: 2.7 ratio (ref 0.0–5.0)
Cholesterol, Total: 178 mg/dL (ref 100–199)
HDL: 65 mg/dL (ref >39)
LDL Calculated: 95 mg/dL (ref 0–99)
TRIGLYCERIDES: 89 mg/dL (ref 0–149)
VLDL Cholesterol Cal: 18 mg/dL (ref 5–40)

## 2018-04-13 ENCOUNTER — Other Ambulatory Visit: Payer: Self-pay | Admitting: Pharmacist

## 2018-04-13 DIAGNOSIS — Z872 Personal history of diseases of the skin and subcutaneous tissue: Secondary | ICD-10-CM | POA: Diagnosis not present

## 2018-04-13 DIAGNOSIS — D1720 Benign lipomatous neoplasm of skin and subcutaneous tissue of unspecified limb: Secondary | ICD-10-CM | POA: Diagnosis not present

## 2018-04-13 DIAGNOSIS — L814 Other melanin hyperpigmentation: Secondary | ICD-10-CM | POA: Diagnosis not present

## 2018-04-13 DIAGNOSIS — L570 Actinic keratosis: Secondary | ICD-10-CM | POA: Diagnosis not present

## 2018-04-13 MED ORDER — ROSUVASTATIN CALCIUM 10 MG PO TABS: ORAL_TABLET | ORAL | 3 refills | Status: DC

## 2018-04-13 NOTE — Telephone Encounter (Signed)
This encounter was created in error - please disregard.

## 2018-04-13 NOTE — Telephone Encounter (Signed)
-----   Message from Tobin Chad, RN sent at 04/13/2018  9:47 AM EST ----- REVIEWED VIA MYCHART ROUTED TO PRIMARY AND [facility]

## 2018-05-18 ENCOUNTER — Telehealth: Payer: Self-pay

## 2018-05-18 NOTE — Telephone Encounter (Signed)
Called and left a msg regarding needing labs for the repatha and crestor if still taking.

## 2018-05-21 ENCOUNTER — Other Ambulatory Visit: Payer: Self-pay

## 2018-05-21 MED ORDER — CLOPIDOGREL BISULFATE 75 MG PO TABS: 75.0000 mg | ORAL_TABLET | Freq: Every day | ORAL | 7 refills | Status: AC

## 2018-05-21 NOTE — Telephone Encounter (Signed)
Rx(s) sent to pharmacy electronically.  

## 2018-05-25 ENCOUNTER — Telehealth: Payer: Self-pay

## 2018-05-25 NOTE — Telephone Encounter (Signed)
Called and left 3rd and final msg asking the pt if they are taking the crestor/repatha and told them through the message that if they are we will need a lipid lab done asa.

## 2018-06-15 DIAGNOSIS — L259 Unspecified contact dermatitis, unspecified cause: Secondary | ICD-10-CM | POA: Diagnosis not present

## 2018-06-28 ENCOUNTER — Telehealth: Payer: Self-pay | Admitting: Cardiology

## 2018-06-28 NOTE — Telephone Encounter (Signed)
New Message   Pt c/o medication issue:  1. Name of Medication: Repatha  2. How are you currently taking this medication (dosage and times per day)? 420mg  injection  3. Are you having a reaction (difficulty breathing--STAT)? NO   4. What is your medication issue? Patient states insurance needs a prior auth so he can continue to get medication.

## 2018-06-28 NOTE — Telephone Encounter (Signed)
Sent a myChart message to the patient.  We need current labs to show benefit before PA can be submitted.

## 2018-06-30 ENCOUNTER — Other Ambulatory Visit: Payer: Self-pay | Admitting: Pharmacist Clinician (PhC)/ Clinical Pharmacy Specialist

## 2018-06-30 DIAGNOSIS — E785 Hyperlipidemia, unspecified: Secondary | ICD-10-CM

## 2018-07-01 LAB — LIPID PANEL
Chol/HDL Ratio: 2.3 ratio (ref 0.0–5.0)
Cholesterol, Total: 140 mg/dL (ref 100–199)
HDL: 62 mg/dL (ref >39)
LDL Calculated: 55 mg/dL (ref 0–99)
Triglycerides: 116 mg/dL (ref 0–149)
VLDL Cholesterol Cal: 23 mg/dL (ref 5–40)

## 2018-07-01 LAB — HEPATIC FUNCTION PANEL
ALT: 35 IU/L (ref 0–44)
AST: 30 IU/L (ref 0–40)
Albumin: 4.2 g/dL (ref 3.7–4.7)
Alkaline Phosphatase: 59 IU/L (ref 39–117)
Bilirubin Total: 0.6 mg/dL (ref 0.0–1.2)
Bilirubin, Direct: 0.19 mg/dL (ref 0.00–0.40)
Total Protein: 6.3 g/dL (ref 6.0–8.5)

## 2018-07-07 NOTE — Telephone Encounter (Signed)
Marcus Mills  Here is the new insurance info for [name]. He was previously Acupuncturist.

## 2018-10-05 IMAGING — DX DG ABDOMEN 2V
3 series · 3 of 3 positions shown · non-contrast
Comparison: Lumbar spine radiographs from 11/04/2011.

CLINICAL DATA: Abdominal pain and diarrhea x3 days

EXAM:
ABDOMEN - 2 VIEW

[w abdomen upright]
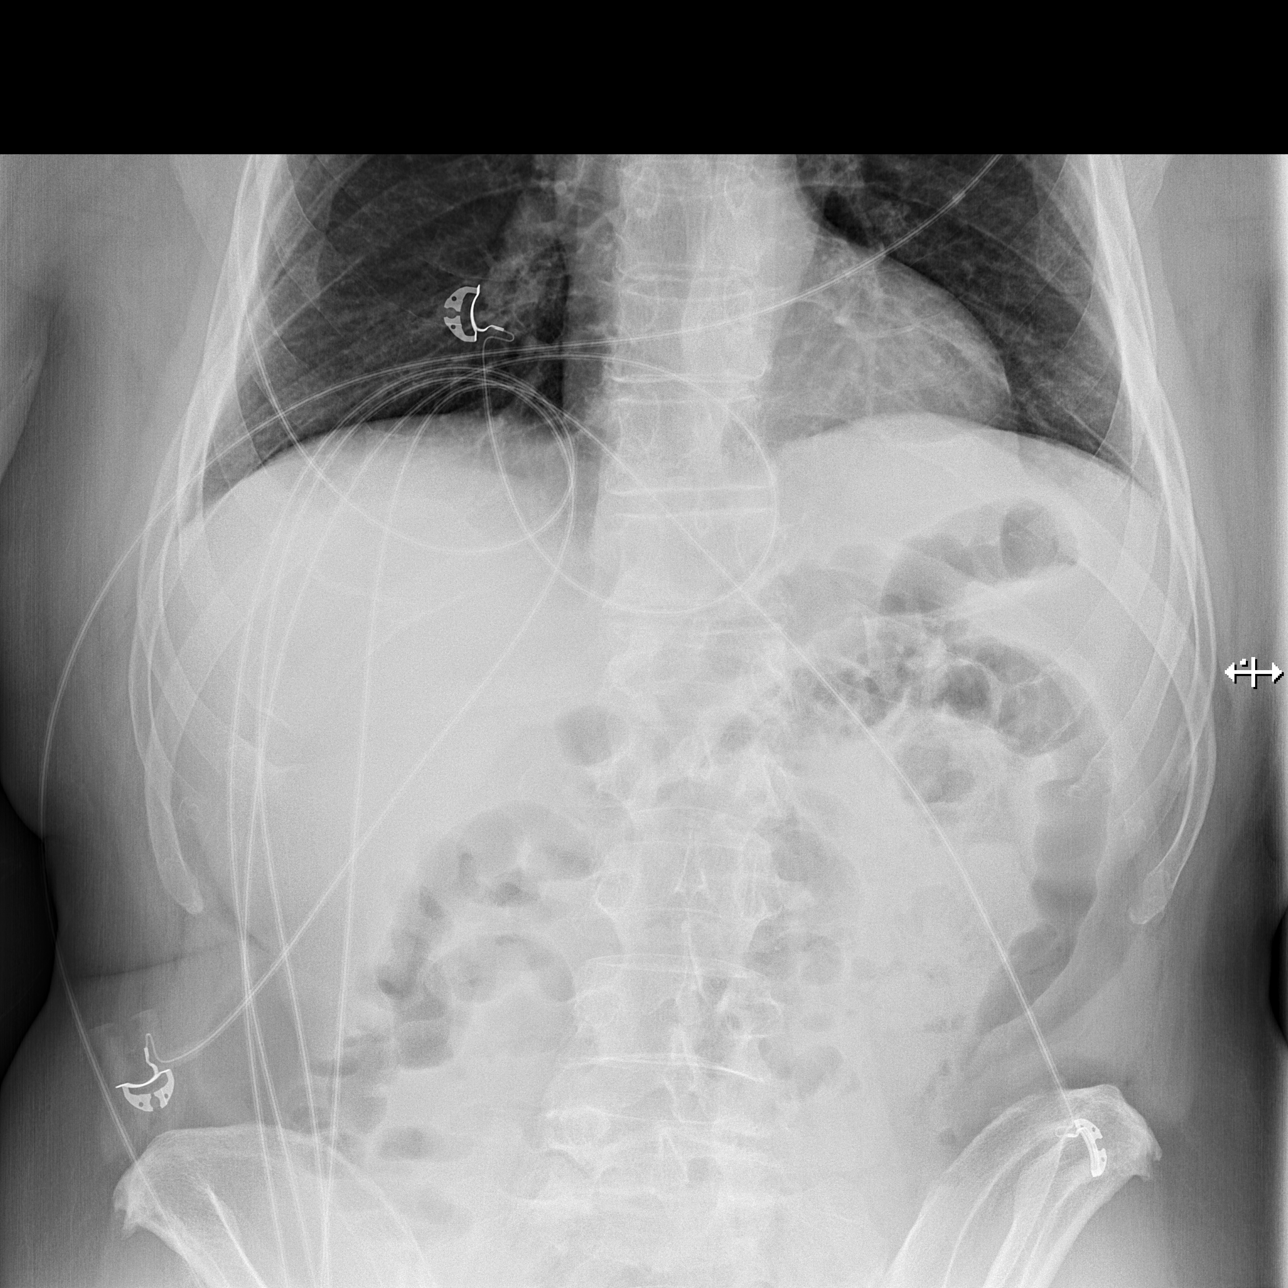

[t abdomen supine (1 of 2)]
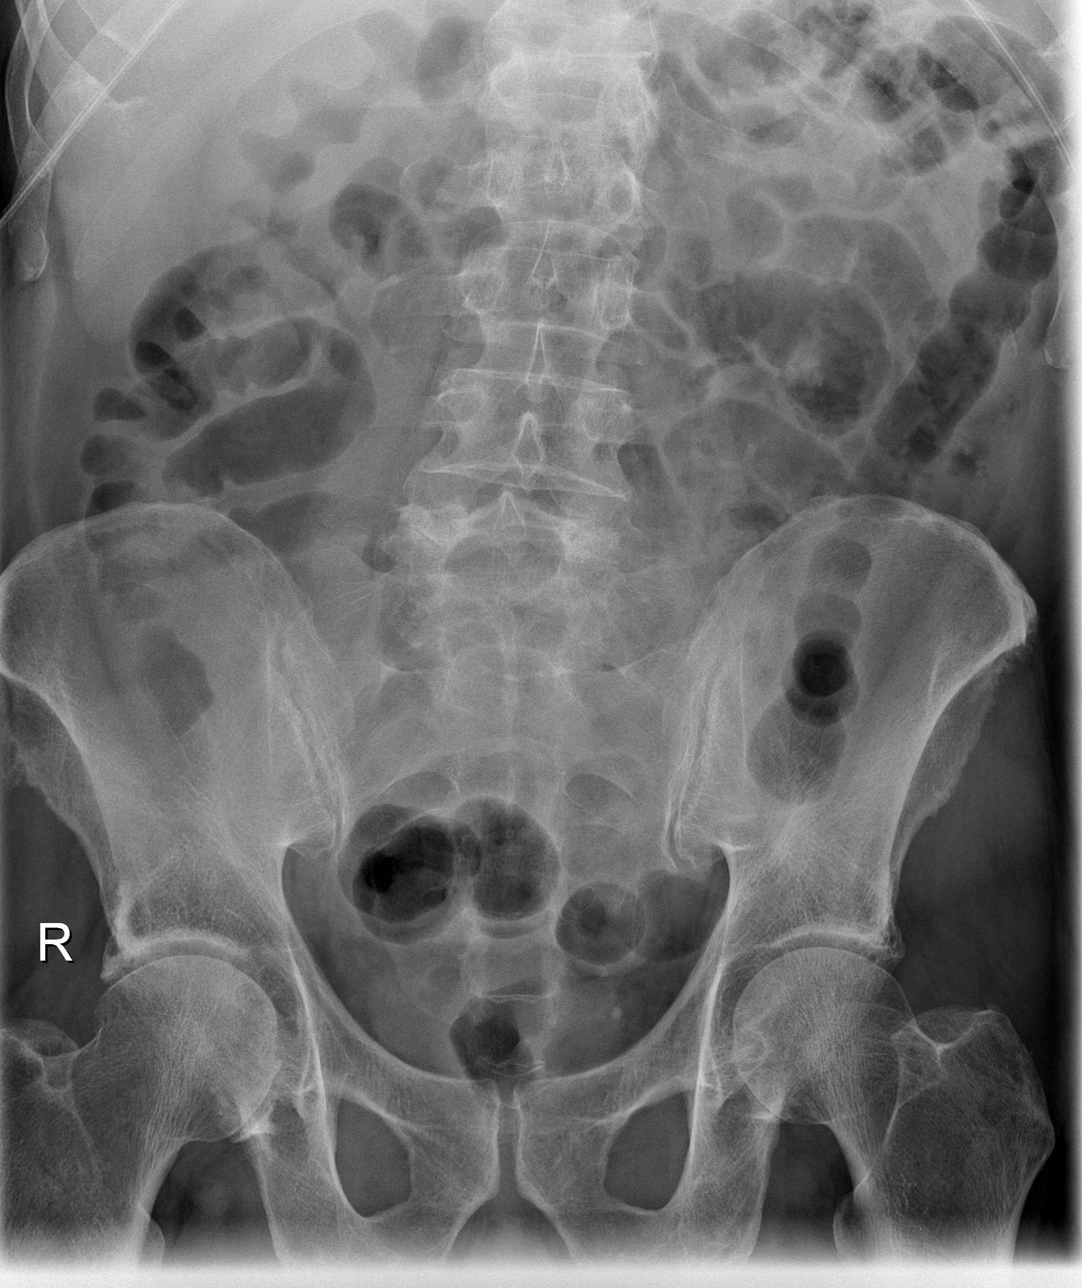

[t abdomen supine (2 of 2)]
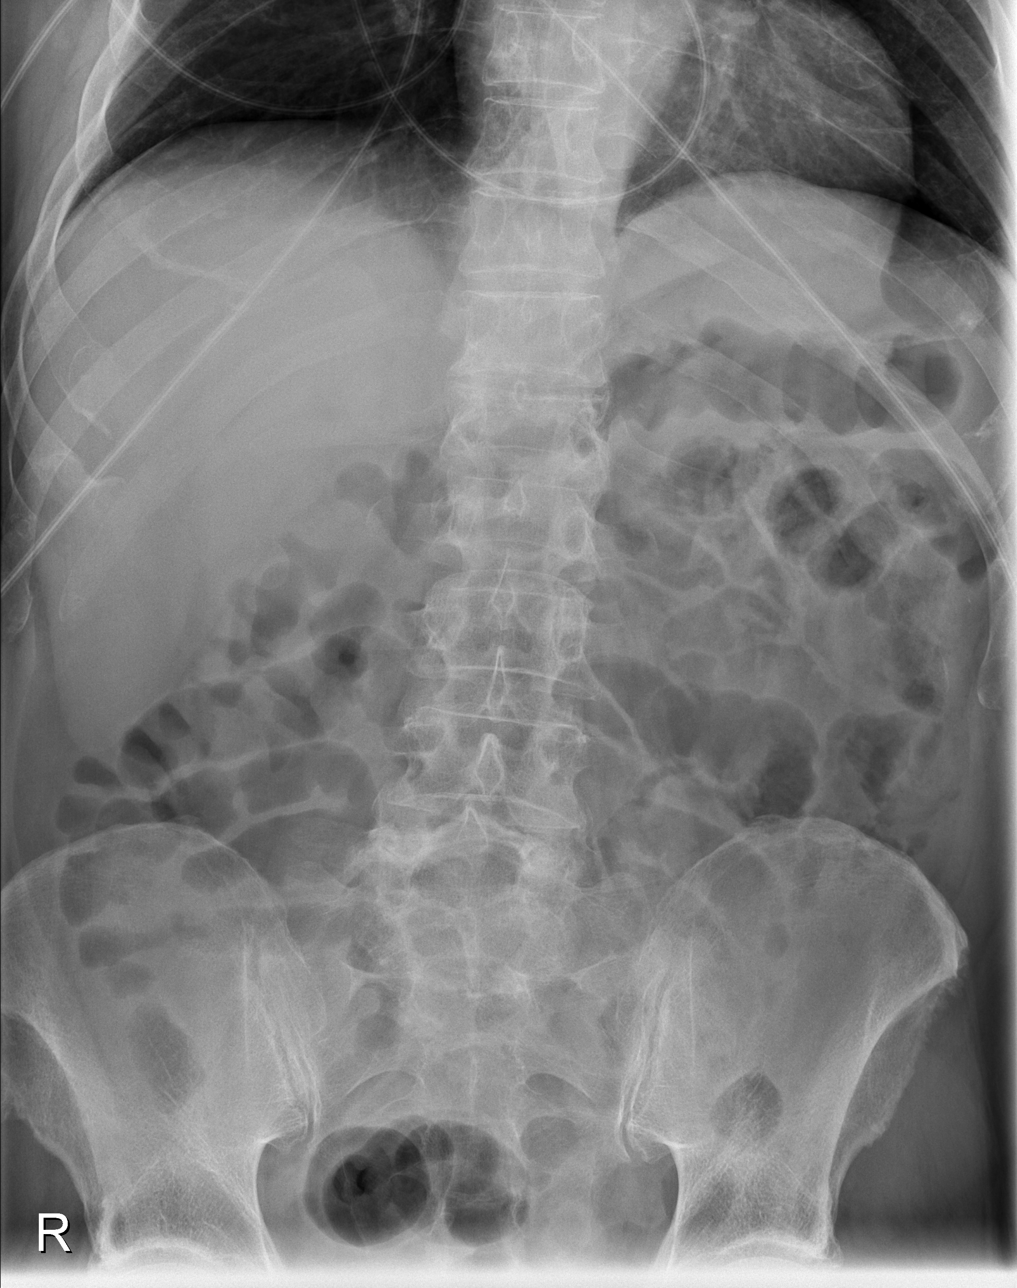

[3 of 3 positions shown; findings below may reference images not displayed]

FINDINGS: There is suggestion of possible "thumbprinting" along the distal
ascending and proximal transverse colon which may represent
submucosal edema and possibly colitis. Scattered air containing
small bowel loops in a nonspecific bowel gas pattern are noted.
There is no free air. Chronic compression deformity of the lowest
rib-bearing vertebral body previously stated as L1 on prior lumbar
spine radiographs from 11/04/2011. No free air. A small
calcification consistent with a phlebolith is seen in the left
hemipelvis.
IMPRESSION: Possible thumbprinting of the distal ascending and proximal
transverse colon which may represent submucosal edema and changes of
colitis.

## 2018-10-22 ENCOUNTER — Other Ambulatory Visit: Payer: Self-pay

## 2018-10-22 ENCOUNTER — Ambulatory Visit (HOSPITAL_COMMUNITY): Payer: Medicare HMO | Attending: Internal Medicine

## 2018-10-22 DIAGNOSIS — I350 Nonrheumatic aortic (valve) stenosis: Secondary | ICD-10-CM | POA: Insufficient documentation

## 2018-12-07 ENCOUNTER — Telehealth: Payer: Self-pay | Admitting: Cardiology

## 2018-12-07 NOTE — Telephone Encounter (Signed)
New message   Patient wants to know if you can give a referral for any cardiologists in the [facility] area. Please advise.

## 2018-12-07 NOTE — Telephone Encounter (Signed)
Any advice for any doctors?  If not, I will notify patient but wanted to check with you. Thanks!

## 2018-12-07 NOTE — Telephone Encounter (Signed)
I do not know any cardiologists @ [facility]

## 2018-12-08 NOTE — Telephone Encounter (Signed)
Called and notified patient of message from MD.  Left voicemail.

## 2018-12-20 ENCOUNTER — Telehealth: Payer: Self-pay | Admitting: *Deleted

## 2018-12-20 NOTE — Telephone Encounter (Signed)
A message was left, re: follow up visit. 

## 2019-01-05 ENCOUNTER — Telehealth: Payer: Self-pay | Admitting: Cardiology

## 2019-01-05 NOTE — Telephone Encounter (Signed)
Kiki from [facility] would like recent records from the patient's care from Dr. Ellyn Hack. The patient will be seen at the Cardiologist in [facility] tomorrow at 9:00 am. Please fax records to [phone]  The office just needs the last office note, monitor reports, echo and EKG sent to them

## 2019-01-11 ENCOUNTER — Other Ambulatory Visit: Payer: Self-pay | Admitting: Cardiology

## 2019-06-14 ENCOUNTER — Other Ambulatory Visit: Payer: Self-pay | Admitting: Cardiology

## 2019-11-08 ENCOUNTER — Other Ambulatory Visit: Payer: Self-pay | Admitting: Cardiology

## 2020-08-10 ENCOUNTER — Other Ambulatory Visit: Payer: Self-pay | Admitting: Cardiology
